# Patient Record
Sex: Female | Born: 1990 | Race: Black or African American | Hispanic: No | Marital: Single | Smoking: Current every day smoker
Health system: Southern US, Community
[De-identification: ages and names within clinical notes are randomized; demographics above are authoritative.]

## PROBLEM LIST (undated history)

## (undated) DIAGNOSIS — N611 Abscess of the breast and nipple: Secondary | ICD-10-CM

## (undated) HISTORY — PX: ANTERIOR CRUCIATE LIGAMENT REPAIR: SHX115

---

## 2012-08-12 ENCOUNTER — Emergency Department (HOSPITAL_COMMUNITY): Payer: 59

## 2012-08-12 ENCOUNTER — Emergency Department (HOSPITAL_COMMUNITY)
Admission: EM | Admit: 2012-08-12 | Discharge: 2012-08-12 | Disposition: A | Discharge status: Home / Self Care | Payer: 59 | Attending: Emergency Medicine | Admitting: Emergency Medicine

## 2012-08-12 ENCOUNTER — Encounter (HOSPITAL_COMMUNITY): Payer: Self-pay | Admitting: *Deleted

## 2012-08-12 DIAGNOSIS — R51 Headache: Secondary | ICD-10-CM | POA: Insufficient documentation

## 2012-08-12 DIAGNOSIS — IMO0002 Reserved for concepts with insufficient information to code with codable children: Secondary | ICD-10-CM | POA: Insufficient documentation

## 2012-08-12 DIAGNOSIS — Y9389 Activity, other specified: Secondary | ICD-10-CM | POA: Insufficient documentation

## 2012-08-12 DIAGNOSIS — Y92410 Unspecified street and highway as the place of occurrence of the external cause: Secondary | ICD-10-CM | POA: Insufficient documentation

## 2012-08-12 DIAGNOSIS — S0993XA Unspecified injury of face, initial encounter: Secondary | ICD-10-CM | POA: Insufficient documentation

## 2012-08-12 MED ORDER — IBUPROFEN 200 MG PO TABS
600.0000 mg | ORAL_TABLET | Freq: Once | ORAL | Status: AC
  Administered 2012-08-12: 600 mg via ORAL
  Filled 2012-08-12: qty 3

## 2012-08-12 MED ORDER — DIAZEPAM 5 MG PO TABS
5.0000 mg | ORAL_TABLET | Freq: Once | ORAL | Status: AC
  Administered 2012-08-12: 5 mg via ORAL
  Filled 2012-08-12: qty 1

## 2012-08-12 MED ORDER — HYDROCODONE-ACETAMINOPHEN 5-325 MG PO TABS: 1.0000 | ORAL_TABLET | Freq: Four times a day (QID) | ORAL | Status: DC | PRN

## 2012-08-12 MED ORDER — HYDROCODONE-ACETAMINOPHEN 5-325 MG PO TABS
1.0000 | ORAL_TABLET | Freq: Once | ORAL | Status: AC
  Administered 2012-08-12: 1 via ORAL
  Filled 2012-08-12: qty 1

## 2012-08-12 MED ORDER — NAPROXEN 500 MG PO TABS: 500.0000 mg | ORAL_TABLET | Freq: Two times a day (BID) | ORAL | Status: DC
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
## 2012-08-12 NOTE — ED Provider Notes (Signed)
Medical screening examination/treatment/procedure(s) were performed by non-physician practitioner and as supervising physician I was immediately available for consultation/collaboration.  Lyanne Co, MD 08/12/12 [phone]

## 2012-08-12 NOTE — ED Notes (Signed)
Pt to CT

## 2012-08-12 NOTE — ED Notes (Signed)
Pt back from CT

## 2012-08-12 NOTE — ED Notes (Signed)
Pt escorted to discharge window. Pt verbalized understanding discharge instructions. In no acute distress.  
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
## 2012-08-14 ENCOUNTER — Encounter (HOSPITAL_COMMUNITY): Payer: Self-pay | Admitting: Emergency Medicine

## 2012-08-14 ENCOUNTER — Emergency Department (HOSPITAL_COMMUNITY)
Admission: EM | Admit: 2012-08-14 | Discharge: 2012-08-14 | Disposition: A | Discharge status: Home / Self Care | Payer: 59 | Attending: Emergency Medicine | Admitting: Emergency Medicine

## 2012-08-14 DIAGNOSIS — F17200 Nicotine dependence, unspecified, uncomplicated: Secondary | ICD-10-CM | POA: Insufficient documentation

## 2012-08-14 DIAGNOSIS — M542 Cervicalgia: Secondary | ICD-10-CM | POA: Insufficient documentation

## 2012-08-14 DIAGNOSIS — M545 Low back pain, unspecified: Secondary | ICD-10-CM | POA: Insufficient documentation

## 2012-08-14 DIAGNOSIS — S134XXD Sprain of ligaments of cervical spine, subsequent encounter: Secondary | ICD-10-CM

## 2012-08-14 DIAGNOSIS — S139XXA Sprain of joints and ligaments of unspecified parts of neck, initial encounter: Secondary | ICD-10-CM | POA: Insufficient documentation

## 2012-08-14 DIAGNOSIS — R51 Headache: Secondary | ICD-10-CM | POA: Insufficient documentation

## 2012-08-14 DIAGNOSIS — Y939 Activity, unspecified: Secondary | ICD-10-CM | POA: Insufficient documentation

## 2012-08-14 DIAGNOSIS — Y92410 Unspecified street and highway as the place of occurrence of the external cause: Secondary | ICD-10-CM | POA: Insufficient documentation
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
## 2012-08-15 NOTE — ED Provider Notes (Signed)
Medical screening examination/treatment/procedure(s) were performed by non-physician practitioner and as supervising physician I was immediately available for consultation/collaboration.     Bailey Faiella R Gearldine Looney, MD 08/15/12 0005 

## 2012-08-24 ENCOUNTER — Ambulatory Visit: Payer: No Typology Code available for payment source | Attending: Family Medicine | Admitting: Physical Therapy

## 2012-08-24 DIAGNOSIS — IMO0001 Reserved for inherently not codable concepts without codable children: Secondary | ICD-10-CM | POA: Insufficient documentation

## 2012-08-24 DIAGNOSIS — M545 Low back pain, unspecified: Secondary | ICD-10-CM | POA: Insufficient documentation

## 2012-08-24 DIAGNOSIS — R293 Abnormal posture: Secondary | ICD-10-CM | POA: Insufficient documentation

## 2012-08-24 DIAGNOSIS — M542 Cervicalgia: Secondary | ICD-10-CM | POA: Insufficient documentation

## 2012-08-27 ENCOUNTER — Ambulatory Visit: Payer: No Typology Code available for payment source | Admitting: Rehabilitation

## 2012-09-01 ENCOUNTER — Ambulatory Visit: Payer: No Typology Code available for payment source | Attending: Family Medicine | Admitting: Physical Therapy

## 2012-09-03 ENCOUNTER — Ambulatory Visit: Payer: No Typology Code available for payment source | Admitting: Physical Therapy

## 2012-09-08 ENCOUNTER — Ambulatory Visit: Payer: No Typology Code available for payment source | Admitting: Physical Therapy

## 2012-09-10 ENCOUNTER — Ambulatory Visit: Payer: No Typology Code available for payment source | Admitting: Rehabilitation

## 2012-11-28 ENCOUNTER — Inpatient Hospital Stay (HOSPITAL_COMMUNITY)
Admission: EM | Admit: 2012-11-28 | Discharge: 2012-11-30 | DRG: 585 | Disposition: A | Discharge status: Home / Self Care | Payer: 59 | Attending: General Surgery | Admitting: General Surgery

## 2012-11-28 ENCOUNTER — Encounter (HOSPITAL_COMMUNITY): Payer: Self-pay | Admitting: Emergency Medicine

## 2012-11-28 ENCOUNTER — Emergency Department (HOSPITAL_COMMUNITY): Payer: 59

## 2012-11-28 DIAGNOSIS — N611 Abscess of the breast and nipple: Secondary | ICD-10-CM

## 2012-11-28 DIAGNOSIS — N61 Mastitis without abscess: Principal | ICD-10-CM | POA: Diagnosis present

## 2012-11-28 DIAGNOSIS — F17200 Nicotine dependence, unspecified, uncomplicated: Secondary | ICD-10-CM | POA: Diagnosis present

## 2012-11-28 HISTORY — DX: Abscess of the breast and nipple: N61.1

## 2012-11-28 LAB — CBC WITH DIFFERENTIAL/PLATELET
Basophils Relative: 0 % (ref 0–1)
Eosinophils Absolute: 0 10*3/uL (ref 0.0–0.7)
Hemoglobin: 12.8 g/dL (ref 12.0–15.0)
Lymphs Abs: 1.9 10*3/uL (ref 0.7–4.0)
Monocytes Relative: 7 % (ref 3–12)
Neutro Abs: 7.9 10*3/uL — ABNORMAL HIGH (ref 1.7–7.7)
Neutrophils Relative %: 74 % (ref 43–77)
Platelets: 332 10*3/uL (ref 150–400)
RBC: 4.56 MIL/uL (ref 3.87–5.11)

## 2012-11-28 LAB — BASIC METABOLIC PANEL
BUN: 10 mg/dL (ref 6–23)
Chloride: 100 mEq/L (ref 96–112)
GFR calc Af Amer: >90 mL/min (ref >90)
GFR calc non Af Amer: >90 mL/min (ref >90)
Glucose, Bld: 96 mg/dL (ref 70–99)
Potassium: 3.6 mEq/L (ref 3.5–5.1)
Sodium: 138 mEq/L (ref 135–145)

## 2012-11-28 MED ORDER — SODIUM CHLORIDE 0.9 % IV SOLN: INTRAVENOUS | Status: DC

## 2012-11-28 MED ORDER — ACETAMINOPHEN 650 MG RE SUPP: 650.0000 mg | Freq: Four times a day (QID) | RECTAL | Status: DC | PRN

## 2012-11-28 MED ORDER — ONDANSETRON HCL 4 MG/2ML IJ SOLN
4.0000 mg | Freq: Once | INTRAMUSCULAR | Status: AC
  Administered 2012-11-28: 4 mg via INTRAVENOUS
  Filled 2012-11-28: qty 2

## 2012-11-28 MED ORDER — SODIUM CHLORIDE 0.9 % IV BOLUS (SEPSIS)
1000.0000 mL | Freq: Once | INTRAVENOUS | Status: AC
  Administered 2012-11-28: 1000 mL via INTRAVENOUS

## 2012-11-28 MED ORDER — MORPHINE SULFATE 4 MG/ML IJ SOLN
4.0000 mg | Freq: Once | INTRAMUSCULAR | Status: AC
  Administered 2012-11-28: 4 mg via INTRAVENOUS
  Filled 2012-11-28: qty 1

## 2012-11-28 MED ORDER — ACETAMINOPHEN 325 MG PO TABS: 650.0000 mg | ORAL_TABLET | Freq: Four times a day (QID) | ORAL | Status: DC | PRN

## 2012-11-28 MED ORDER — SODIUM CHLORIDE 0.9 % IV SOLN
3.0000 g | Freq: Four times a day (QID) | INTRAVENOUS | Status: DC
  Administered 2012-11-29 – 2012-11-30 (×7): 3 g via INTRAVENOUS
  Filled 2012-11-28 (×9): qty 3

## 2012-11-28 MED ORDER — MORPHINE SULFATE 4 MG/ML IJ SOLN
4.0000 mg | Freq: Once | INTRAMUSCULAR | Status: AC | PRN
  Administered 2012-11-28: 4 mg via INTRAVENOUS
  Filled 2012-11-28: qty 1

## 2012-11-28 MED ORDER — ONDANSETRON HCL 4 MG/2ML IJ SOLN
4.0000 mg | Freq: Four times a day (QID) | INTRAMUSCULAR | Status: DC | PRN
  Administered 2012-11-29 (×2): 4 mg via INTRAVENOUS
  Filled 2012-11-28 (×2): qty 2

## 2012-11-28 MED ORDER — MORPHINE SULFATE 2 MG/ML IJ SOLN
2.0000 mg | INTRAMUSCULAR | Status: DC | PRN
  Administered 2012-11-29: 2 mg via INTRAVENOUS
  Filled 2012-11-28 (×2): qty 1

## 2012-11-28 MED ORDER — OXYCODONE HCL 5 MG PO TABS: 5.0000 mg | ORAL_TABLET | ORAL | Status: DC | PRN

## 2012-11-28 NOTE — ED Provider Notes (Signed)
CSN: [id number]     Arrival date & time 11/28/12  1416 History  This chart was scribed for non-physician practitioner, Marlon Pel, PA-C working with Doug Sou, MD by Greggory Stallion, ED scribe. This patient was seen in room TR05C/TR05C and the patient's care was started at 3:09 PM.   Chief Complaint  Patient presents with  . Breast Pain   The history is provided by the patient. No language interpreter was used.    HPI Comments: Martha Nash is a 22 y.o. female who presents to the Emergency Department complaining of gradual onset, intermittent right breat redness and swelling that started 1 week ago. She states it normally starts when her menses does and ends when the menses does. Pt states it has increased in size and tenderness. Pt denies fever, emesis, weakness and nipple discharge as associated symptoms. Pt is not currently breastfeeding. Pt has no chronic medical conditions.   History reviewed. No pertinent past medical history. Past Surgical History  Procedure Laterality Date  . Anterior cruciate ligament repair     History reviewed. No pertinent family history. History  Substance Use Topics  . Smoking status: Current Every Day Smoker    Types: Cigarettes  . Smokeless tobacco: Not on file  . Alcohol Use: No   OB History   Grav Para Term Preterm Abortions TAB SAB Ect Mult Living                 Review of Systems  Constitutional: Negative for fever.  Gastrointestinal: Negative for vomiting.  Skin:       Right breast pain  Neurological: Negative for weakness.  All other systems reviewed and are negative.    Allergies  Review of patient's allergies indicates no known allergies.  Home Medications  No current outpatient prescriptions on file.  BP 127/86  Pulse 91  Temp(Src) 98.1 F (36.7 C) (Oral)  Resp 18  SpO2 99%  Physical Exam  Nursing note and vitals reviewed. Constitutional: She is oriented to person, place, and time. She appears well-developed  and well-nourished. No distress.  HENT:  Head: Normocephalic and atraumatic.  Eyes: EOM are normal.  Neck: Normal range of motion. Neck supple. No tracheal deviation present.  Cardiovascular: Normal rate.   Pulmonary/Chest: Effort normal. No respiratory distress.  Musculoskeletal: Normal range of motion.  Neurological: She is alert and oriented to person, place, and time.  Skin: Skin is warm and dry. She is not diaphoretic.  Right breast large area of induration, swelling, and tenderness that involves the nipple. The nipple is inverted.   Psychiatric: She has a normal mood and affect. Her behavior is normal.    ED Course   Procedures (including critical care time)  DIAGNOSTIC STUDIES: Oxygen Saturation is 99% on RA, normal by my interpretation.    COORDINATION OF CARE: 3:30 PM- Dr. Ethelda Chick has seen the pt and recommends getting ultrasound of right breast, starting IV pain medication and antibiotic. Pt agrees to plan. I consulted with radiologist who informs me that he will be able to [place] the right breast for [place]. Will see if abscess formation has developed for I&D or if surgeon needs to be consulted.   Labs Reviewed  CBC WITH DIFFERENTIAL - Abnormal; Notable for the following:    WBC 10.7 (*)    Neutro Abs 7.9 (*)    All other components within normal limits  BASIC METABOLIC PANEL   US Breast Right  11/28/2012   *RADIOLOGY REPORT*  Clinical Data:  22 year old female  with right subareolar breast swelling and tenderness.  RIGHT BREAST ULTRASOUND  Comparison:  None  On physical exam, erythema in the areolar and perihilar regions noted.  Findings: Ultrasound is performed, showing a 2.2 x 5.2 x 5 cm abscess in the lower right subareolar breast.  Overlying skin thickening/cellulitis noted. There is no evidence of solid mass in this area.  IMPRESSION: 2.2 x 5.2 x 5 cm right subareolar abscess.   Original Report Authenticated By: Harmon Pier, M.D.   1. Breast abscess     MDM  Pt has  large breast abscess to right breast. I have spoken with Dr. Dwain Sarna [facility]) who has agreed to drain abscess.  Will start Ancef Abx. Pt currently stable.  I personally performed the services described in this documentation, which was scribed in my presence. The recorded information has been reviewed and is accurate.   Dorthula Matas, PA-C 11/28/12 1744

## 2012-11-28 NOTE — ED Notes (Signed)
Pt instructed not to eat or drink anything.  Pt voices understanding.  Pt moved to A-2 and report given to [place], [place]

## 2012-11-28 NOTE — Consult Note (Signed)
Reason for Consult:right breast abscess Referring Physician: Dr Remi Haggard  Martha Nash is an 22 y.o. female.  HPI: 22 yo otherwise healthy female except for smoking who presents with a one week history of increasing right breast pain.  This has become more red, larger and tender over the last week. There has been no drainage.  She denies fevers.  Had a prior episode with some pain in right subareolar area that resolved spontaneously.    History reviewed. No pertinent past medical history.  Past Surgical History  Procedure Laterality Date  . Anterior cruciate ligament repair      History reviewed. No pertinent family history.  Social History:  reports that she has been smoking Cigarettes.  She has been smoking about 0.00 packs per day. She does not have any smokeless tobacco history on file. She reports that she does not drink alcohol or use illicit drugs.  Allergies: No Known Allergies  Medications: I have reviewed the patient's current medications.  Results for orders placed during the hospital encounter of 11/28/12 (from the past 48 hour(s))  CBC WITH DIFFERENTIAL     Status: Abnormal   Collection Time    11/28/12  4:08 PM      Result Value Range   WBC 10.7 (*) 4.0 - 10.5 K/uL   RBC 4.56  3.87 - 5.11 MIL/uL   Hemoglobin 12.8  12.0 - 15.0 g/dL   HCT 16.1  09.6 - 04.5 %   MCV 81.4  78.0 - 100.0 fL   MCH 28.1  26.0 - 34.0 pg   MCHC 34.5  30.0 - 36.0 g/dL   RDW 40.9  81.1 - 91.4 %   Platelets 332  150 - 400 K/uL   Neutrophils Relative % 74  43 - 77 %   Neutro Abs 7.9 (*) 1.7 - 7.7 K/uL   Lymphocytes Relative 18  12 - 46 %   Lymphs Abs 1.9  0.7 - 4.0 K/uL   Monocytes Relative 7  3 - 12 %   Monocytes Absolute 0.8  0.1 - 1.0 K/uL   Eosinophils Relative 0  0 - 5 %   Eosinophils Absolute 0.0  0.0 - 0.7 K/uL   Basophils Relative 0  0 - 1 %   Basophils Absolute 0.0  0.0 - 0.1 K/uL  BASIC METABOLIC PANEL     Status: None   Collection Time    11/28/12  4:08 PM      Result  Value Range   Sodium 138  135 - 145 mEq/L   Potassium 3.6  3.5 - 5.1 mEq/L   Chloride 100  96 - 112 mEq/L   CO2 27  19 - 32 mEq/L   Glucose, Bld 96  70 - 99 mg/dL   BUN 10  6 - 23 mg/dL   Creatinine, Ser 7.82  0.50 - 1.10 mg/dL   Calcium 9.4  8.4 - 95.6 mg/dL   GFR calc non Af Amer >90  >90 mL/min   GFR calc Af Amer >90  >90 mL/min   Comment:            The eGFR has been calculated     using the CKD EPI equation.     This calculation has not been     validated in all clinical     situations.     eGFR's persistently     <90 mL/min signify     possible Chronic Kidney Disease.    US Breast Right  11/28/2012   *  RADIOLOGY REPORT*  Clinical Data:  22 year old female with right subareolar breast swelling and tenderness.  RIGHT BREAST ULTRASOUND  Comparison:  None  On physical exam, erythema in the areolar and perihilar regions noted.  Findings: Ultrasound is performed, showing a 2.2 x 5.2 x 5 cm abscess in the lower right subareolar breast.  Overlying skin thickening/cellulitis noted. There is no evidence of solid mass in this area.  IMPRESSION: 2.2 x 5.2 x 5 cm right subareolar abscess.   Original Report Authenticated By: Harmon Pier, M.D.    Review of Systems  Constitutional: Negative for fever and chills.  Respiratory: Negative for cough.   Cardiovascular: Negative for chest pain.  Gastrointestinal: Negative for abdominal pain.  Musculoskeletal: Negative for myalgias.   Blood pressure 119/77, pulse 68, temperature 98.1 F (36.7 C), temperature source Oral, resp. rate 16, SpO2 100.00%. Physical Exam  Nursing note and vitals reviewed. Constitutional: She appears well-developed and well-nourished.  Eyes: No scleral icterus.  Cardiovascular: Normal rate, regular rhythm and normal heart sounds.   Respiratory: Effort normal and breath sounds normal. She has no wheezes. She has no rales. Right breast exhibits skin change and tenderness (right breast abscess with fluctuance, tenderness and  erythema).  Lymphadenopathy:    She has no cervical adenopathy.  Skin: She is not diaphoretic.    Assessment/Plan: Right breast abscess  This has been present for a week.  The OR is full right now so I discussed a right breast abscess incision and drainage in the morning.  I will place on iv abx.  We discussed open wound postop and smoking cessation also.  Martha Nash 11/28/2012, 7:38 PM

## 2012-11-28 NOTE — ED Provider Notes (Signed)
Patient with large swollen red and tender area over right breast. It involves most of breast and nipple. Not currently breast-feeding. No other complaint.  Doug Sou, MD 11/28/12 1537

## 2012-11-28 NOTE — ED Provider Notes (Signed)
Medical screening examination/treatment/procedure(s) were conducted as a shared visit with non-physician practitioner(s) and myself.  I personally evaluated the patient during the encounter  Doug Sou, MD 11/28/12 2003

## 2012-11-28 NOTE — ED Notes (Addendum)
Pt sts redness and swelling to right breast with x 2 with menstrual cycle; pt sts painful; pt denies fever or discharge from nipple

## 2012-11-28 NOTE — ED Notes (Signed)
Right breast red, hard and painful. Onset 1 week ago. Had a similar episode in June but it resolved itself. States it happens with menstrual cycle.

## 2012-11-29 ENCOUNTER — Encounter (HOSPITAL_COMMUNITY): Payer: Self-pay | Admitting: Anesthesiology

## 2012-11-29 ENCOUNTER — Encounter (HOSPITAL_COMMUNITY): Admission: EM | Disposition: A | Discharge status: Home / Self Care | Payer: Self-pay | Source: Home / Self Care

## 2012-11-29 ENCOUNTER — Encounter (HOSPITAL_COMMUNITY): Payer: Self-pay | Admitting: *Deleted

## 2012-11-29 ENCOUNTER — Observation Stay (HOSPITAL_COMMUNITY): Payer: 59 | Admitting: Anesthesiology

## 2012-11-29 HISTORY — PX: IRRIGATION AND DEBRIDEMENT ABSCESS: SHX5252

## 2012-11-29 LAB — SURGICAL PCR SCREEN
MRSA, PCR: NEGATIVE
Staphylococcus aureus: NEGATIVE

## 2012-11-29 SURGERY — IRRIGATION AND DEBRIDEMENT ABSCESS
Anesthesia: General | Site: Breast | Laterality: Right | Wound class: Dirty or Infected

## 2012-11-29 MED ORDER — ONDANSETRON HCL 4 MG/2ML IJ SOLN: 4.0000 mg | Freq: Once | INTRAMUSCULAR | Status: AC | PRN

## 2012-11-29 MED ORDER — LIDOCAINE HCL (CARDIAC) 20 MG/ML IV SOLN
INTRAVENOUS | Status: DC | PRN
  Administered 2012-11-29: 80 mg via INTRAVENOUS

## 2012-11-29 MED ORDER — FENTANYL CITRATE 0.05 MG/ML IJ SOLN
INTRAMUSCULAR | Status: DC | PRN
  Administered 2012-11-29: 150 ug via INTRAVENOUS
  Administered 2012-11-29: 100 ug via INTRAVENOUS

## 2012-11-29 MED ORDER — PROPOFOL 10 MG/ML IV BOLUS
INTRAVENOUS | Status: DC | PRN
  Administered 2012-11-29: 200 mg via INTRAVENOUS

## 2012-11-29 MED ORDER — MIDAZOLAM HCL 5 MG/5ML IJ SOLN
INTRAMUSCULAR | Status: DC | PRN
  Administered 2012-11-29: 2 mg via INTRAVENOUS

## 2012-11-29 MED ORDER — LACTATED RINGERS IV SOLN
INTRAVENOUS | Status: DC | PRN
  Administered 2012-11-29: 07:00:00 via INTRAVENOUS

## 2012-11-29 MED ORDER — ONDANSETRON HCL 4 MG/2ML IJ SOLN
INTRAMUSCULAR | Status: DC | PRN
  Administered 2012-11-29: 4 mg via INTRAVENOUS

## 2012-11-29 MED ORDER — 0.9 % SODIUM CHLORIDE (POUR BTL) OPTIME
TOPICAL | Status: DC | PRN
  Administered 2012-11-29: 1000 mL

## 2012-11-29 MED ORDER — HYDROMORPHONE HCL PF 1 MG/ML IJ SOLN
0.2500 mg | INTRAMUSCULAR | Status: DC | PRN
  Administered 2012-11-29 (×2): 0.5 mg via INTRAVENOUS

## 2012-11-29 MED ORDER — DEXAMETHASONE SODIUM PHOSPHATE 4 MG/ML IJ SOLN
INTRAMUSCULAR | Status: DC | PRN
  Administered 2012-11-29: 4 mg via INTRAVENOUS

## 2012-11-29 SURGICAL SUPPLY — 27 items
BANDAGE GAUZE ELAST BULKY 4 IN (GAUZE/BANDAGES/DRESSINGS) IMPLANT
CANISTER SUCTION 2500CC (MISCELLANEOUS) ×2 IMPLANT
CLOTH BEACON ORANGE TIMEOUT ST (SAFETY) ×2 IMPLANT
COVER SURGICAL LIGHT HANDLE (MISCELLANEOUS) ×2 IMPLANT
DRAPE LAPAROSCOPIC ABDOMINAL (DRAPES) ×2 IMPLANT
DRAPE UTILITY 15X26 W/TAPE STR (DRAPE) ×4 IMPLANT
DRSG PAD ABDOMINAL 8X10 ST (GAUZE/BANDAGES/DRESSINGS) ×2 IMPLANT
ELECT CAUTERY BLADE 6.4 (BLADE) ×2 IMPLANT
ELECT REM PT RETURN 9FT ADLT (ELECTROSURGICAL) ×2
ELECTRODE REM PT RTRN 9FT ADLT (ELECTROSURGICAL) ×1 IMPLANT
GAUZE PACKING IODOFORM 1/4X5 (PACKING) ×2 IMPLANT
GLOVE BIO SURGEON STRL SZ8 (GLOVE) ×2 IMPLANT
GLOVE BIOGEL PI IND STRL 8 (GLOVE) ×1 IMPLANT
GLOVE BIOGEL PI INDICATOR 8 (GLOVE) ×1
GOWN STRL NON-REIN LRG LVL3 (GOWN DISPOSABLE) ×2 IMPLANT
GOWN STRL REIN XL XLG (GOWN DISPOSABLE) ×2 IMPLANT
KIT BASIN OR (CUSTOM PROCEDURE TRAY) ×2 IMPLANT
KIT ROOM TURNOVER OR (KITS) ×2 IMPLANT
NS IRRIG 1000ML POUR BTL (IV SOLUTION) ×2 IMPLANT
PACK GENERAL/GYN (CUSTOM PROCEDURE TRAY) ×2 IMPLANT
PAD ARMBOARD 7.5X6 YLW CONV (MISCELLANEOUS) ×2 IMPLANT
SPONGE GAUZE 4X4 12PLY (GAUZE/BANDAGES/DRESSINGS) ×2 IMPLANT
SWAB COLLECTION DEVICE MRSA (MISCELLANEOUS) ×2 IMPLANT
TAPE CLOTH SURG 6X10 WHT LF (GAUZE/BANDAGES/DRESSINGS) ×2 IMPLANT
TOWEL OR 17X24 6PK STRL BLUE (TOWEL DISPOSABLE) ×2 IMPLANT
TOWEL OR 17X26 10 PK STRL BLUE (TOWEL DISPOSABLE) ×2 IMPLANT
TUBE ANAEROBIC SPECIMEN COL (MISCELLANEOUS) ×2 IMPLANT

## 2012-11-29 NOTE — Anesthesia Preprocedure Evaluation (Addendum)
Anesthesia Evaluation  Patient identified by MRN, date of birth, ID band Patient awake    Reviewed: Allergy & Precautions, H&P , NPO status , Patient's Chart, lab work & pertinent test results  Airway Mallampati: I TM Distance: >3 FB Neck ROM: Full    Dental  (+) Teeth Intact and Dental Advisory Given   Pulmonary Current Smoker,  breath sounds clear to auscultation        Cardiovascular negative cardio ROS  Rhythm:Regular Rate:Normal     Neuro/Psych negative neurological ROS  negative psych ROS   GI/Hepatic negative GI ROS, Neg liver ROS,   Endo/Other  negative endocrine ROS  Renal/GU negative Renal ROS     Musculoskeletal negative musculoskeletal ROS (+)   Abdominal (+) + obese,   Peds  Hematology negative hematology ROS (+)   Anesthesia Other Findings   Reproductive/Obstetrics negative OB ROS                         Anesthesia Physical Anesthesia Plan  ASA: II  Anesthesia Plan: General   Post-op Pain Management:    Induction: Intravenous  Airway Management Planned: LMA  Additional Equipment:   Intra-op Plan:   Post-operative Plan: Extubation in OR  Informed Consent: I have reviewed the patients History and Physical, chart, labs and discussed the procedure including the risks, benefits and alternatives for the proposed anesthesia with the patient or authorized representative who has indicated his/her understanding and acceptance.   Dental advisory given and History available from chart only  Plan Discussed with: CRNA and Anesthesiologist  Anesthesia Plan Comments: (R. Breast Abscess  Smoker  Plan GA with LMA  Kipp Brood, MD)       Anesthesia Quick Evaluation

## 2012-11-29 NOTE — Anesthesia Procedure Notes (Signed)
Procedure Name: LMA Insertion Date/Time: 11/29/2012 8:09 AM Performed by: Leona Singleton A Pre-anesthesia Checklist: Patient identified, Emergency Drugs available, Suction available and Patient being monitored Patient Re-evaluated:Patient Re-evaluated prior to inductionOxygen Delivery Method: Circle system utilized Preoxygenation: Pre-oxygenation with 100% oxygen Intubation Type: IV induction Ventilation: Mask ventilation without difficulty LMA: LMA inserted LMA Size: 4.0 Tube type: Oral Number of attempts: 1 Placement Confirmation: positive ETCO2 and breath sounds checked- equal and bilateral Tube secured with: Tape Dental Injury: Teeth and Oropharynx as per pre-operative assessment

## 2012-11-29 NOTE — Preoperative (Signed)
Beta Blockers   Reason not to administer Beta Blockers:Not Applicable 

## 2012-11-29 NOTE — Transfer of Care (Signed)
Immediate Anesthesia Transfer of Care Note  Patient: Martha Nash  Procedure(s) Performed: Procedure(s): IRRIGATION AND DEBRIDEMENT RIGHT BREAST ABSCESS (Right)  Patient Location: PACU  Anesthesia Type:General  Level of Consciousness: awake, alert , oriented and patient cooperative  Airway & Oxygen Therapy: Patient Spontanous Breathing  Post-op Assessment: Report given to PACU RN and Post -op Vital signs reviewed and stable  Post vital signs: Reviewed and stable  Complications: No apparent anesthesia complications

## 2012-11-29 NOTE — Progress Notes (Signed)
Day of Surgery  Subjective: Pain R breast  Objective: Vital signs in last 24 hours: Temp:  [98.1 F (36.7 C)-99.2 F (37.3 C)] 99.2 F (37.3 C) (08/10 0550) Pulse Rate:  [67-91] 68 (08/09 2030) Resp:  [16-18] 18 (08/10 0550) BP: (119-127)/(60-86) 121/70 mmHg (08/10 0550) SpO2:  [99 %-100 %] 99 % (08/10 0550) Weight:  [110.3 kg (243 lb 2.7 oz)] 110.3 kg (243 lb 2.7 oz) (08/09 2030) Last BM Date: 11/28/12  Intake/Output from previous day: 08/09 0701 - 08/10 0700 In: 1000 [I.V.:1000] Out: -  Intake/Output this shift:    Resp: clear to auscultation bilaterally Cardio: regular rate and rhythm GI: soft, NT R breast with periareolar erythema and fluctuance, tender  Lab Results:   Recent Labs  11/28/12 1608  WBC 10.7*  HGB 12.8  HCT 37.1  PLT 332   BMET  Recent Labs  11/28/12 1608  NA 138  K 3.6  CL 100  CO2 27  GLUCOSE 96  BUN 10  CREATININE 0.83  CALCIUM 9.4   PT/INR No results found for this basename: LABPROT, INR,  in the last 72 hours ABG No results found for this basename: PHART, PCO2, PO2, HCO3,  in the last 72 hours  Studies/Results: US Breast Right  11/28/2012   *RADIOLOGY REPORT*  Clinical Data:  22 year old female with right subareolar breast swelling and tenderness.  RIGHT BREAST ULTRASOUND  Comparison:  None  On physical exam, erythema in the areolar and perihilar regions noted.  Findings: Ultrasound is performed, showing a 2.2 x 5.2 x 5 cm abscess in the lower right subareolar breast.  Overlying skin thickening/cellulitis noted. There is no evidence of solid mass in this area.  IMPRESSION: 2.2 x 5.2 x 5 cm right subareolar abscess.   Original Report Authenticated By: Harmon Pier, M.D.    Anti-infectives: Anti-infectives   Start     Dose/Rate Route Frequency Ordered Stop   11/28/12 2200  Ampicillin-Sulbactam (UNASYN) 3 g in sodium chloride 0.9 % 100 mL IVPB     3 g 100 mL/hr over 60 Minutes Intravenous Every 6 hours 11/28/12 2130         Assessment/Plan: s/p Procedure(s): IRRIGATION AND DEBRIDEMENT RIGHT BREAST ABSCESS (Right) R breast abscess - to or for I&D. Procedure, risks, and benefits D/W patient and she agrees.  LOS: 1 day    Ashia Dehner E 11/29/2012

## 2012-11-29 NOTE — Op Note (Signed)
11/28/2012 - 11/29/2012  8:27 AM  PATIENT:  Martha Nash  22 y.o. female  PRE-OPERATIVE DIAGNOSIS:  right breast abscess  POST-OPERATIVE DIAGNOSIS:  right breast abscess  PROCEDURE:  Procedure(s): IRRIGATION, DRAINAGE,  AND DEBRIDEMENT RIGHT BREAST ABSCESS  SURGEON:  Surgeon(s): [name], MD  PHYSICIAN ASSISTANT:   ASSISTANTS: none   ANESTHESIA:   general  EBL:     BLOOD ADMINISTERED:none  DRAINS: none   SPECIMEN:  No Specimen  DISPOSITION OF SPECIMEN:  N/A  COUNTS:  YES  DICTATION: .Dragon Dictation  Patient was admitted with a 5 cm sub-areola right breast abscess. She is brought for incision and drainage. Informed consent was obtained. Her site was marked.She is on antibiotic protocol IV. She was brought to the operating room and general anesthesia with laryngeal mask airway was administered by the anesthesia staff. Right breast was prepped and draped in sterile fashion. We did time out procedure. Initially 2 cm blister was debrided away with gentle dissection along the lateral aspect of her areola. Next a curvilinear incision along her area was made on the lateral aspect. Subcutaneous tissues were dissected down entering a large abscess cavity. There was purulent material there. This was sent for culture. Some loculations in the cavity were broken up. It was fully evacuated. It was then irrigated multiple times with sterile saline. Hemostasis was obtained with cautery. Wound was packed with quarter-inch form gauze and a sterile dressing was applied. All counts were correct. Patient tolerated procedure well without apparent complication and was taken recovery in stable condition.  PATIENT DISPOSITION:  PACU - hemodynamically stable.   Delay start of Pharmacological VTE agent (>24hrs) due to surgical blood loss or risk of bleeding:  no  Violeta Gelinas, MD, MPH, FACS Pager: [phone]  8/10/20148:27 AM

## 2012-11-29 NOTE — Anesthesia Postprocedure Evaluation (Signed)
  Anesthesia Post-op Note  Patient: Martha Nash  Procedure(s) Performed: Procedure(s): IRRIGATION AND DEBRIDEMENT RIGHT BREAST ABSCESS (Right)  Patient Location: PACU  Anesthesia Type:General  Level of Consciousness: awake, alert  and oriented  Airway and Oxygen Therapy: Patient Spontanous Breathing  Post-op Pain: mild  Post-op Assessment: Post-op Vital signs reviewed, Patient's Cardiovascular Status Stable, Respiratory Function Stable, Patent Airway and Pain level controlled  Post-op Vital Signs: stable  Complications: No apparent anesthesia complications

## 2012-11-30 ENCOUNTER — Encounter (HOSPITAL_COMMUNITY): Payer: Self-pay | Admitting: General Surgery

## 2012-11-30 DIAGNOSIS — N611 Abscess of the breast and nipple: Secondary | ICD-10-CM

## 2012-11-30 HISTORY — DX: Abscess of the breast and nipple: N61.1

## 2012-11-30 MED ORDER — AMOXICILLIN-POT CLAVULANATE 875-125 MG PO TABS: 1.0000 | ORAL_TABLET | Freq: Two times a day (BID) | ORAL | Status: DC

## 2012-11-30 MED ORDER — OXYCODONE HCL 5 MG PO TABS: 5.0000 mg | ORAL_TABLET | Freq: Four times a day (QID) | ORAL | Status: DC | PRN

## 2012-11-30 MED ORDER — IBUPROFEN 600 MG PO TABS: 600.0000 mg | ORAL_TABLET | Freq: Four times a day (QID) | ORAL | Status: DC | PRN

## 2012-11-30 MED ORDER — IBUPROFEN 200 MG PO TABS: ORAL_TABLET | ORAL | Status: DC

## 2012-11-30 MED ORDER — ACETAMINOPHEN 325 MG PO TABS: 650.0000 mg | ORAL_TABLET | Freq: Four times a day (QID) | ORAL | Status: DC | PRN

## 2012-11-30 NOTE — Discharge Summary (Signed)
Physician Discharge Summary  Patient ID: Martha Nash MRN: [id number] DOB/AGE: 11-28-1990 22 y.o.  Admit date: 11/28/2012 Discharge date: 11/30/2012  Admission Diagnoses:  Right breast abscess  Discharge Diagnoses:  Same Principal Problem:   Abscess of breast, right   PROCEDURES:  IRRIGATION, DRAINAGE, AND DEBRIDEMENT RIGHT BREAST ABSCESS, 11/29/2012, [name], MD.   Hospital Course:  22 yo otherwise healthy female except for smoking who presents with a one week history of increasing right breast pain. This has become more red, larger and tender over the last week. There has been no drainage. She denies fevers. Had a prior episode with some pain in right subareolar area that resolved spontaneously.  She was admitted and take to the OR for surgery the following AM.  She did well post op with the dressing changed.  Her cellulitis was better, but still present.  She was discharged home on antibiotics for 1 week.  Dressing changes at home with some minimal packing, and follow up next week in the Urgent clinic.  Dr. Janee Morn was not in the office for the next two weeks.  Condition on d/c:  Improved   Disposition: 01-Home or Self Care      Discharge Orders   Future Appointments Provider Department Dept Phone   12/07/2012 2:30 PM Lodema Pilot, DO Urgent [facility], [place] [phone]   Future Orders Complete By Expires     Change dressing  As directed     Scheduling Instructions:      You can shower and then let packing fall out or remove.  Wash area with soap and water. Repack with just a single layer of iodoform dressing to the base if the wound.   Do dressing change twice a day.  The wound should get smaller and need less packing each day. Call if you have redness, pain, fever or drainage from site.        Medication List         acetaminophen 325 MG tablet  Commonly known as:  TYLENOL  Take 2 tablets (650 mg total) by mouth every 6 (six) hours as  needed.     amoxicillin-clavulanate 875-125 MG per tablet  Commonly known as:  AUGMENTIN  Take 1 tablet by mouth every 12 (twelve) hours.     ibuprofen 200 MG tablet  Commonly known as:  ADVIL,MOTRIN  You can take 2-3 tablets every 6 hours as needed for pain.     oxyCODONE 5 MG immediate release tablet  Commonly known as:  Oxy IR/ROXICODONE  Take 1-2 tablets (5-10 mg total) by mouth every 6 (six) hours as needed for pain (For packing wound also.).       Follow-up Information   Follow up with Lodema Pilot DAVID, DO On 12/07/2012. (Your appointment is at 2:30, be there 30 minutes early for check in;)    Contact information:   [address] [place] [address] [phone]       Signed: Sherrie George 11/30/2012, 1:47 PM

## 2012-11-30 NOTE — Progress Notes (Signed)
Discharge Note. Pt given discharge instructions, follow up appointment, and Rx's. Medications reviewed with pt and when to take them. Education done on signs and symptoms of infections. Pt receptive. Pt ready for discharge. Pt reported she is calling a ride to come get her.

## 2012-11-30 NOTE — Progress Notes (Signed)
Pt to d/c home with [facility] for dressing changes, W->D BID. Pt reports that she will be able to do dressing changes using the mirror at the times that the [facility] is not able to come. Discussed s/s infection and importance of taking full course of antibiotics. Address and phone number listed in the system are correct as confirmed by patient. Agency will be [facility] per pt choice and insurance network options.

## 2012-11-30 NOTE — Progress Notes (Addendum)
1 Day Post-Op  Subjective: She is doing well this AM, still pretty sore and tender.  Objective: Vital signs in last 24 hours: Temp:  [97.9 F (36.6 C)-98.8 F (37.1 C)] 98.1 F (36.7 C) (08/11 0528) Pulse Rate:  [61-77] 61 (08/11 0528) Resp:  [16-18] 18 (08/11 0528) BP: (111-132)/(61-75) 119/65 mmHg (08/11 0528) SpO2:  [96 %-100 %] 100 % (08/11 0528) Last BM Date: 11/28/12 Diet: regular, afebrile, VSS No labs Intake/Output from previous day: 08/10 0701 - 08/11 0700 In: 1620 [P.O.:120; I.V.:1200; IV Piggyback:300] Out: 50 [Urine:50] Intake/Output this shift:    General appearance: alert, cooperative and no distress Skin: She has an open area with packing under the areola of the right breast.  She has some mild cellulitis surrounding the proximal incision.  Lab Results:   Recent Labs  11/28/12 1608  WBC 10.7*  HGB 12.8  HCT 37.1  PLT 332    BMET  Recent Labs  11/28/12 1608  NA 138  K 3.6  CL 100  CO2 27  GLUCOSE 96  BUN 10  CREATININE 0.83  CALCIUM 9.4   PT/INR No results found for this basename: LABPROT, INR,  in the last 72 hours  No results found for this basename: AST, ALT, ALKPHOS, BILITOT, PROT, ALBUMIN,  in the last 168 hours   Lipase  No results found for this basename: lipase     Studies/Results: US Breast Right  11/28/2012   *RADIOLOGY REPORT*  Clinical Data:  22 year old female with right subareolar breast swelling and tenderness.  RIGHT BREAST ULTRASOUND  Comparison:  None  On physical exam, erythema in the areolar and perihilar regions noted.  Findings: Ultrasound is performed, showing a 2.2 x 5.2 x 5 cm abscess in the lower right subareolar breast.  Overlying skin thickening/cellulitis noted. There is no evidence of solid mass in this area.  IMPRESSION: 2.2 x 5.2 x 5 cm right subareolar abscess.   Original Report Authenticated By: Harmon Pier, M.D.    Medications: . ampicillin-sulbactam (UNASYN) IV  3 g Intravenous Q6H     Assessment/Plan right breast abscess, s/p IRRIGATION, DRAINAGE, AND DEBRIDEMENT RIGHT BREAST ABSCESS, 11/29/2012, [name], MD   Plan:  We removed the packing and Placed new packing to the base of the incision with 1 inch iodoform.  Then a dry sterile dressing.  We will ask Home health to see and help with dressing changes at home.  We will keep her on antibiotics at home for a week.  Dr. Janee Morn is not in the office for 2 weeks so we have set her up to go to the Urgent office and Dr. Biagio Quint next Monday. [facility] [phone]  11/30/2012 3:37 PM     LOS: 2 days    Martha Nash 11/30/2012

## 2012-12-01 NOTE — Progress Notes (Signed)
Agree with A&P of WJ,PA Patient verbalizwes understanding of post op care plans

## 2012-12-05 LAB — CULTURE, ROUTINE-ABSCESS

## 2012-12-05 LAB — ANAEROBIC CULTURE

## 2012-12-07 ENCOUNTER — Ambulatory Visit (INDEPENDENT_AMBULATORY_CARE_PROVIDER_SITE_OTHER): Payer: 59 | Admitting: General Surgery

## 2012-12-07 ENCOUNTER — Encounter (INDEPENDENT_AMBULATORY_CARE_PROVIDER_SITE_OTHER): Payer: Self-pay | Admitting: General Surgery

## 2012-12-07 VITALS — BP 120/68 | HR 80 | Resp 14 | Ht 68.0 in | Wt 240.8 lb

## 2012-12-07 DIAGNOSIS — Z4889 Encounter for other specified surgical aftercare: Secondary | ICD-10-CM

## 2012-12-07 DIAGNOSIS — Z5189 Encounter for other specified aftercare: Secondary | ICD-10-CM

## 2012-12-07 NOTE — Progress Notes (Signed)
Subjective:     Patient ID: Martha Nash, female   DOB: 1990/11/23, 22 y.o.   MRN: [id number]  HPI The patient follows up one week status post incision and drainage of right breast abscess. She says that she feels much better. She has one more day remaining of the amoxicillin but the tenderness and redness has improved. No fevers and chills. She is changing the dressing daily.  Review of Systems     Objective:   Physical Exam Her incisions is wide and very shallow with healthy granulation tissue at the base and really doesn't need much packing. I covered the wound with a Telfa pad since it really isn't anything left intact. There is no other significant or suspicious masses or residual sign of infection.    Assessment:     Status post incision and drainage of right breast abscess-doing well She is healing very well from her procedure. No evidence of any residual infection. Is no evidence of residual mass or concern for malignancy. I recommended that she do her breast exams after this is completely healed and if there is any hardness or suspicious mass that she should call his back for repeat examination. Otherwise, I think this should continue to heal with dressing changes. She can follow up with Dr. Janee Morn in about 3 weeks if this is not completely healed.     Plan:     Continue dressing changes and followup in 3 weeks if this is not completely healed. Recommend self breast exams and follow up with surgery if there is any residual mass. Finish antibiotics

## 2013-05-13 ENCOUNTER — Emergency Department (HOSPITAL_COMMUNITY)
Admission: EM | Admit: 2013-05-13 | Discharge: 2013-05-14 | Disposition: A | Discharge status: Home / Self Care | Payer: 59 | Attending: Emergency Medicine | Admitting: Emergency Medicine

## 2013-05-13 DIAGNOSIS — Z3202 Encounter for pregnancy test, result negative: Secondary | ICD-10-CM | POA: Insufficient documentation

## 2013-05-13 DIAGNOSIS — N61 Mastitis without abscess: Secondary | ICD-10-CM | POA: Insufficient documentation

## 2013-05-13 DIAGNOSIS — N611 Abscess of the breast and nipple: Secondary | ICD-10-CM

## 2013-05-13 DIAGNOSIS — F17200 Nicotine dependence, unspecified, uncomplicated: Secondary | ICD-10-CM | POA: Insufficient documentation

## 2013-05-13 LAB — COMPREHENSIVE METABOLIC PANEL
ALK PHOS: 90 U/L (ref 39–117)
ALT: 15 U/L (ref 0–35)
AST: 19 U/L (ref 0–37)
Albumin: 3.3 g/dL — ABNORMAL LOW (ref 3.5–5.2)
BILIRUBIN TOTAL: 0.2 mg/dL — AB (ref 0.3–1.2)
BUN: 11 mg/dL (ref 6–23)
CHLORIDE: 101 meq/L (ref 96–112)
CO2: 26 meq/L (ref 19–32)
CREATININE: 0.72 mg/dL (ref 0.50–1.10)
Calcium: 8.9 mg/dL (ref 8.4–10.5)
GFR calc Af Amer: >90 mL/min (ref >90)
Glucose, Bld: 105 mg/dL — ABNORMAL HIGH (ref 70–99)
POTASSIUM: 3.7 meq/L (ref 3.7–5.3)
Sodium: 140 mEq/L (ref 137–147)
Total Protein: 8 g/dL (ref 6.0–8.3)

## 2013-05-13 LAB — URINALYSIS, ROUTINE W REFLEX MICROSCOPIC
GLUCOSE, UA: NEGATIVE mg/dL
HGB URINE DIPSTICK: NEGATIVE
KETONES UR: 15 mg/dL — AB
Leukocytes, UA: NEGATIVE
Nitrite: NEGATIVE
PROTEIN: NEGATIVE mg/dL
Specific Gravity, Urine: 1.036 — ABNORMAL HIGH (ref 1.005–1.030)
UROBILINOGEN UA: 2 mg/dL — AB (ref 0.0–1.0)
pH: 7 (ref 5.0–8.0)

## 2013-05-13 LAB — CBC WITH DIFFERENTIAL/PLATELET
BASOS ABS: 0 10*3/uL (ref 0.0–0.1)
Basophils Relative: 1 % (ref 0–1)
Eosinophils Absolute: 0.1 10*3/uL (ref 0.0–0.7)
Eosinophils Relative: 1 % (ref 0–5)
HEMATOCRIT: 38.8 % (ref 36.0–46.0)
HEMOGLOBIN: 13.1 g/dL (ref 12.0–15.0)
LYMPHS PCT: 25 % (ref 12–46)
Lymphs Abs: 2.1 10*3/uL (ref 0.7–4.0)
MCH: 27.9 pg (ref 26.0–34.0)
MCHC: 33.8 g/dL (ref 30.0–36.0)
MCV: 82.6 fL (ref 78.0–100.0)
MONO ABS: 0.6 10*3/uL (ref 0.1–1.0)
MONOS PCT: 7 % (ref 3–12)
NEUTROS ABS: 5.7 10*3/uL (ref 1.7–7.7)
NEUTROS PCT: 67 % (ref 43–77)
Platelets: 324 10*3/uL (ref 150–400)
RBC: 4.7 MIL/uL (ref 3.87–5.11)
RDW: 13.7 % (ref 11.5–15.5)
WBC: 8.5 10*3/uL (ref 4.0–10.5)

## 2013-05-13 LAB — PREGNANCY, URINE: PREG TEST UR: NEGATIVE

## 2013-05-13 NOTE — ED Provider Notes (Signed)
CSN: [id number]     Arrival date & time 05/13/13  2038 History   First MD Initiated Contact with Patient 05/13/13 2308     Chief Complaint  Patient presents with  . Breast Pain    Right   (Consider location/radiation/quality/duration/timing/severity/associated sxs/prior Treatment) HPI Comments: Patient is a 23 y/o female with a hx of R breast abscess who presents for redness, swelling, and TTP of her R breast x 4 days. Patient states symptoms have been gradually worsening since onset. She has taken Aleve for symptoms without relief. Patient states pressure to the area worsens her pain. She denies associated fever, chills, chest pain, shortness of breath, nipple discharge, numbness/tingling, and extremity weakness.  The history is provided by the patient. No language interpreter was used.    Past Medical History  Diagnosis Date  . Abscess of breast, right 11/30/2012   Past Surgical History  Procedure Laterality Date  . Anterior cruciate ligament repair    . Irrigation and debridement abscess Right 11/29/2012    Procedure: IRRIGATION AND DEBRIDEMENT RIGHT BREAST ABSCESS;  Surgeon: Liz MaladyBurke E Thompson, MD;  Location: [facility] OR;  Service: General;  Laterality: Right;   No family history on file. History  Substance Use Topics  . Smoking status: Current Every Day Smoker    Types: Cigarettes  . Smokeless tobacco: Not on file  . Alcohol Use: No   OB History   Grav Para Term Preterm Abortions TAB SAB Ect Mult Living                 Review of Systems  Constitutional: Negative for fever.  Respiratory: Negative for shortness of breath.   Cardiovascular: Negative for chest pain.  Gastrointestinal: Negative for vomiting.  Musculoskeletal:       +R breast pain  Skin: Positive for color change. Negative for pallor and wound.  Neurological: Negative for weakness and numbness.  All other systems reviewed and are negative.    Allergies  Review of patient's allergies indicates no known  allergies.  Home Medications   Current Outpatient Rx  Name  Route  Sig  Dispense  Refill  . naproxen sodium (ANAPROX) 220 MG tablet   Oral   Take 440 mg by mouth 2 (two) times daily as needed (for pain).         . cephALEXin (KEFLEX) 500 MG capsule   Oral   Take 1 capsule (500 mg total) by mouth 4 (four) times daily.   28 capsule   0   . sulfamethoxazole-trimethoprim (BACTRIM DS,SEPTRA DS) 800-160 MG per tablet   Oral   Take 1 tablet by mouth 2 (two) times daily.   14 tablet   0    BP 108/59  Pulse 64  Temp(Src) 97.8 F (36.6 C) (Oral)  Resp 14  Ht 5\' 8"  (1.727 m)  Wt 249 lb 9.6 oz (113.218 kg)  BMI 37.96 kg/m2  SpO2 99%  LMP 04/08/2013  Physical Exam  Nursing note and vitals reviewed. Constitutional: She is oriented to person, place, and time. She appears well-developed and well-nourished. No distress.  HENT:  Head: Normocephalic and atraumatic.  Eyes: Conjunctivae and EOM are normal. No scleral icterus.  Neck: Normal range of motion.  Cardiovascular: Normal rate, regular rhythm and intact distal pulses.   Distal radial pulses 2+ b/l  Pulmonary/Chest: Effort normal. No respiratory distress.  +TTP and erythema of R breast with induration extending from 7 o'clock position at edge of areola toward nipple and inferiorly. Associated erythema with heat  to touch; approximately 7cm in diameter without red linear streaking. Nipple inverted; this appears c/w prior exam and patient endorses this to be chronic.  Musculoskeletal: Normal range of motion.  Neurological: She is alert and oriented to person, place, and time.  No gross sensory deficits appreciated. Patient moves extremities without ataxia.  Skin: Skin is warm and dry. No rash noted. She is not diaphoretic. No erythema. No pallor.  Psychiatric: She has a normal mood and affect. Her behavior is normal.    ED Course  Procedures (including critical care time) Labs Review Labs Reviewed  COMPREHENSIVE METABOLIC  PANEL - Abnormal; Notable for the following:    Glucose, Bld 105 (*)    Albumin 3.3 (*)    Total Bilirubin 0.2 (*)    All other components within normal limits  URINALYSIS, ROUTINE W REFLEX MICROSCOPIC - Abnormal; Notable for the following:    Color, Urine AMBER (*)    Specific Gravity, Urine 1.036 (*)    Bilirubin Urine SMALL (*)    Ketones, ur 15 (*)    Urobilinogen, UA 2.0 (*)    All other components within normal limits  CBC WITH DIFFERENTIAL  PREGNANCY, URINE   Imaging Review [place] Misc Soft Tissue  05/14/2013   CLINICAL DATA:  Right breast swelling and erythema. History of right breast abscess.  EXAM: ULTRASOUND OF CHEST SOFT TISSUES  TECHNIQUE: Ultrasound examination of the chest wall soft tissues was performed in the area of clinical concern.  COMPARISON:  Right breast ultrasound performed 11/28/2012  FINDINGS: There is a complex predominantly fluid filled collection at the right breast, in the 7-8 o'clock region near the areola. It measures approximately 3.2 x 1.8 x 1.6 cm, with a diffusely thickened wall, internal echoes and mild associated peripheral blood flow. This is concerning for a residual or recurrent small abscess at the site of the patient's prior abscess; it is decreased in size from August 2014.  IMPRESSION: Residual or recurrent small abscess noted at the site of the patient's prior abscess, in the 7-8 o'clock region of the right breast near the areola. It measures approximately 3.2 x 1.8 x 1.6 cm, with a diffusely thickened wall and mild associated peripheral blood flow.   Electronically Signed   By: Roanna Raider M.D.   On: 05/14/2013 02:54    EKG Interpretation   None       INCISION AND DRAINAGE Performed by: Antony Madura Consent: Verbal consent obtained. Risks and benefits: risks, benefits and alternatives were discussed Type: abscess  Body area: R breast  Anesthesia: local infiltration  Incision was made with a scalpel.  Local anesthetic: lidocaine 2%  with epinephrine  Anesthetic total: 2 ml  Complexity: complex Blunt dissection to break up loculations  Drainage: purulent  Drainage amount: copious; approx 8cc  Packing material: 1/4 in iodoform gauze  Patient tolerance: Patient tolerated the procedure well with no immediate complications.    MDM   1. Abscess of breast, right    Uncomplicated R breast abscess. Patient well and nontoxic appearing, hemodynamically stable and afebrile. She is neurovascularly intact. Ultrasound shows a residual small abscess in the 7-8 o'clock region of the right breast near the areola. Abscess I&D'd at beside without complication with copious drainage. No red linear streaking or nipple d/c. Patient stable for d/c with course of Bactrim/Keflex. She has been instructed to followup with [facility] for further evaluation of her symptoms as well as to return in 48 hours for packing removal. Warm compresses advised  as well as ibuprofen for pain control. Return precautions provided and patient agreeable to plan with no unaddressed concerns.   Filed Vitals:   05/14/13 0000 05/14/13 0030 05/14/13 0200 05/14/13 0419  BP: 116/65 113/65 108/59 132/76  Pulse: 88 87 64 84  Temp:      TempSrc:      Resp:   14 16  Height:      Weight:      SpO2: 96% 98% 99% 99%     Antony Madura, PA-C 05/14/13 0423

## 2013-05-13 NOTE — ED Notes (Addendum)
Pt reports right breast pain. Pt states she has a hx of right breast abscess with similar symptoms tonight. Pt states she feels a "lump", warm to the touch and tender with no drainage.

## 2013-05-13 NOTE — ED Notes (Signed)
Pt states the last time she had an abscess on her right breast she had to go the OR to have it drained

## 2013-05-14 ENCOUNTER — Emergency Department (HOSPITAL_COMMUNITY): Payer: 59

## 2013-05-14 MED ORDER — SULFAMETHOXAZOLE-TRIMETHOPRIM 800-160 MG PO TABS: 1.0000 | ORAL_TABLET | Freq: Two times a day (BID) | ORAL | Status: AC

## 2013-05-14 MED ORDER — CEPHALEXIN 500 MG PO CAPS: 500.0000 mg | ORAL_CAPSULE | Freq: Four times a day (QID) | ORAL | Status: DC

## 2013-05-14 MED ORDER — OXYCODONE-ACETAMINOPHEN 5-325 MG PO TABS
2.0000 | ORAL_TABLET | Freq: Once | ORAL | Status: AC
  Administered 2013-05-14: 2 via ORAL
  Filled 2013-05-14: qty 2

## 2013-05-14 NOTE — ED Notes (Signed)
Ultrasound tech at bedside with female NT chaperone .

## 2013-05-14 NOTE — Discharge Instructions (Signed)
Take the antibiotics prescribed. Take 600mg  ibuprofen every 6 hours for pain control. Recommend warm compresses every 6-8 hours to promote drainage. Do not change your dressing for the first 24 hours. After 24 hours, change at least daily. Do not replace packing if packing falls out prior to 48 hour recheck.  Abscess An abscess is an infected area that contains a collection of pus and debris.It can occur in almost any part of the body. An abscess is also known as a furuncle or boil. CAUSES  An abscess occurs when tissue gets infected. This can occur from blockage of oil or sweat glands, infection of hair follicles, or a minor injury to the skin. As the body tries to fight the infection, pus collects in the area and creates pressure under the skin. This pressure causes pain. People with weakened immune systems have difficulty fighting infections and get certain abscesses more often.  SYMPTOMS Usually an abscess develops on the skin and becomes a painful mass that is red, warm, and tender. If the abscess forms under the skin, you may feel a moveable soft area under the skin. Some abscesses break open (rupture) on their own, but most will continue to get worse without care. The infection can spread deeper into the body and eventually into the bloodstream, causing you to feel ill.  DIAGNOSIS  Your caregiver will take your medical history and perform a physical exam. A sample of fluid may also be taken from the abscess to determine what is causing your infection. TREATMENT  Your caregiver may prescribe antibiotic medicines to fight the infection. However, taking antibiotics alone usually does not cure an abscess. Your caregiver may need to make a small cut (incision) in the abscess to drain the pus. In some cases, gauze is packed into the abscess to reduce pain and to continue draining the area. HOME CARE INSTRUCTIONS   Only take over-the-counter or prescription medicines for pain, discomfort, or fever as  directed by your caregiver.  If you were prescribed antibiotics, take them as directed. Finish them even if you start to feel better.  If gauze is used, follow your caregiver's directions for changing the gauze.  To avoid spreading the infection:  Keep your draining abscess covered with a bandage.  Wash your hands well.  Do not share personal care items, towels, or whirlpools with others.  Avoid skin contact with others.  Keep your skin and clothes clean around the abscess.  Keep all follow-up appointments as directed by your caregiver. SEEK MEDICAL CARE IF:   You have increased pain, swelling, redness, fluid drainage, or bleeding.  You have muscle aches, chills, or a general ill feeling.  You have a fever. MAKE SURE YOU:   Understand these instructions.  Will watch your condition.  Will get help right away if you are not doing well or get worse. Document Released: 01/16/2005 Document Revised: 10/08/2011 Document Reviewed: 06/21/2011 [facility] Patient Information 2014 [place], [place].

## 2013-05-14 NOTE — ED Provider Notes (Signed)
Medical screening examination/treatment/procedure(s) were performed by non-physician practitioner and as supervising physician I was immediately available for consultation/collaboration.    Olivia Mackielga M Quanta Roher, MD 05/14/13 [id number]

## 2013-08-25 ENCOUNTER — Encounter (HOSPITAL_COMMUNITY): Payer: Self-pay | Admitting: Emergency Medicine

## 2013-08-25 ENCOUNTER — Emergency Department (HOSPITAL_COMMUNITY)
Admission: EM | Admit: 2013-08-25 | Discharge: 2013-08-25 | Disposition: A | Discharge status: Home / Self Care | Payer: 59 | Source: Home / Self Care | Attending: Family Medicine | Admitting: Family Medicine

## 2013-08-25 ENCOUNTER — Emergency Department (INDEPENDENT_AMBULATORY_CARE_PROVIDER_SITE_OTHER): Payer: 59

## 2013-08-25 DIAGNOSIS — M65839 Other synovitis and tenosynovitis, unspecified forearm: Secondary | ICD-10-CM

## 2013-08-25 DIAGNOSIS — M65849 Other synovitis and tenosynovitis, unspecified hand: Secondary | ICD-10-CM

## 2013-08-25 DIAGNOSIS — M778 Other enthesopathies, not elsewhere classified: Secondary | ICD-10-CM

## 2013-08-25 MED ORDER — PREDNISONE 10 MG PO KIT: PACK | ORAL | Status: DC

## 2013-08-25 NOTE — Discharge Instructions (Signed)
Thank you for coming in today. Follow up with Dr. Farris HasKramer if not getting better.  Take the prednisone as directed.

## 2013-08-25 NOTE — ED Provider Notes (Signed)
Martha Nash is a 23 y.o. female who presents to Urgent Care today for left wrist pain. Pain present for 2 days. Patient denies any injury. 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 She is right-handed. Pain is present on the radial to dorsal aspect of the wrist. Pain worse with wrist motion and thumb motion. No radiating pain weakness or numbness. No fevers chills nausea vomiting or diarrhea. She's tried some NSAIDs which have helped a bit. She feels well otherwise.   Past Medical History  Diagnosis Date  . Abscess of breast, right 11/30/2012   History  Substance Use Topics  . Smoking status: Current Every Day Smoker    Types: Cigarettes  . Smokeless tobacco: Not on file  . Alcohol Use: No   ROS as above Medications: No current facility-administered medications for this encounter.   Current Outpatient Prescriptions  Medication Sig Dispense Refill  . naproxen sodium (ANAPROX) 220 MG tablet Take 440 mg by mouth 2 (two) times daily as needed (for pain).      . PredniSONE 10 MG KIT 12 day dose pack po  1 kit  0    Exam:  BP 110/69  Pulse 98  Temp(Src) 98.9 F (37.2 C) (Oral)  Resp 18  SpO2 100%  LMP 08/25/2013 Gen: Well NAD Left wrist: Normal-appearing Mildly tender at the radial styloid and across the dorsum of the wrist to the ulnar aspect. Normal wrist motion. Negative Finkelstein's or Tinel's test or Phalen's test.  Capillary refill sensation and pulses are intact  No results found for this or any previous visit (from the past 24 hour(s)). Dg Wrist Complete Left  08/25/2013   CLINICAL DATA:  WRIST PAIN WRIST PAIN pain across the left wrist.  EXAM: LEFT WRIST - COMPLETE 3+ VIEW  COMPARISON:  None.  FINDINGS: There is no evidence of fracture or dislocation. There is no evidence of arthropathy or other focal bone abnormality. Soft tissues are unremarkable.  IMPRESSION: Negative.   Electronically Signed   By: Dereck Ligas M.D.   On: 08/25/2013 18:36     Assessment and Plan: 23 y.o. female with wrist tendinitis. Likely secondary to overuse injury. Plan to treat with prednisone and thumb spica splint for a week. Followup with sports medicine if not improving.  Discussed warning signs or symptoms. Please see discharge instructions. Patient expresses understanding.    Gregor Hams, MD 08/25/13 [phone]

## 2013-08-25 NOTE — ED Notes (Signed)
Left wrist pain and swelling.  Symptoms noticed Monday after working.  Patient cuts meat, is right handed.  Radial pulse 2 plus and cap refill brisk

## 2014-02-05 ENCOUNTER — Emergency Department (HOSPITAL_COMMUNITY)
Admission: EM | Admit: 2014-02-05 | Discharge: 2014-02-05 | Disposition: A | Discharge status: Home / Self Care | Payer: 59 | Source: Home / Self Care | Attending: Family Medicine | Admitting: Family Medicine

## 2014-02-05 ENCOUNTER — Encounter (HOSPITAL_COMMUNITY): Payer: Self-pay | Admitting: Emergency Medicine

## 2014-02-05 ENCOUNTER — Other Ambulatory Visit (HOSPITAL_COMMUNITY)
Admission: RE | Admit: 2014-02-05 | Discharge: 2014-02-05 | Disposition: A | Discharge status: Home / Self Care | Payer: 59 | Source: Ambulatory Visit | Attending: Family Medicine | Admitting: Family Medicine

## 2014-02-05 DIAGNOSIS — N760 Acute vaginitis: Secondary | ICD-10-CM | POA: Insufficient documentation

## 2014-02-05 DIAGNOSIS — N730 Acute parametritis and pelvic cellulitis: Secondary | ICD-10-CM

## 2014-02-05 DIAGNOSIS — Z113 Encounter for screening for infections with a predominantly sexual mode of transmission: Secondary | ICD-10-CM | POA: Insufficient documentation

## 2014-02-05 LAB — POCT URINALYSIS DIP (DEVICE)
Bilirubin Urine: NEGATIVE
Glucose, UA: NEGATIVE mg/dL
HGB URINE DIPSTICK: NEGATIVE
KETONES UR: NEGATIVE mg/dL
Leukocytes, UA: NEGATIVE
Nitrite: NEGATIVE
PH: 6.5 (ref 5.0–8.0)
PROTEIN: NEGATIVE mg/dL
SPECIFIC GRAVITY, URINE: 1.025 (ref 1.005–1.030)
UROBILINOGEN UA: 1 mg/dL (ref 0.0–1.0)

## 2014-02-05 LAB — POCT PREGNANCY, URINE: Preg Test, Ur: NEGATIVE

## 2014-02-05 MED ORDER — CEFTRIAXONE SODIUM 250 MG IJ SOLR
INTRAMUSCULAR | Status: AC
  Filled 2014-02-05: qty 250

## 2014-02-05 MED ORDER — LIDOCAINE HCL (PF) 1 % IJ SOLN
INTRAMUSCULAR | Status: AC
  Filled 2014-02-05: qty 5

## 2014-02-05 MED ORDER — CEFTRIAXONE SODIUM 250 MG IJ SOLR
250.0000 mg | Freq: Once | INTRAMUSCULAR | Status: AC
  Administered 2014-02-05: 250 mg via INTRAMUSCULAR

## 2014-02-05 MED ORDER — DOXYCYCLINE HYCLATE 100 MG PO CAPS: 100.0000 mg | ORAL_CAPSULE | Freq: Two times a day (BID) | ORAL | Status: DC

## 2014-02-05 MED ORDER — METRONIDAZOLE 500 MG PO TABS: 500.0000 mg | ORAL_TABLET | Freq: Two times a day (BID) | ORAL | Status: DC

## 2014-02-05 NOTE — Discharge Instructions (Signed)
Pelvic Inflammatory Disease °Pelvic inflammatory disease (PID) refers to an infection in some or all of the female organs. The infection can be in the uterus, ovaries, fallopian tubes, or the surrounding tissues in the pelvis. PID can cause abdominal or pelvic pain that comes on suddenly (acute pelvic pain). PID is a serious infection because it can lead to lasting (chronic) pelvic pain or the inability to have children (infertile).  °CAUSES  °The infection is often caused by the normal bacteria found in the vaginal tissues. PID may also be caused by an infection that is spread during sexual contact. PID can also occur following:  °· The birth of a baby.   °· A miscarriage.   °· An abortion.   °· Major pelvic surgery.   °· The use of an intrauterine device (IUD).   °· A sexual assault.   °RISK FACTORS °Certain factors can put a person at higher risk for PID, such as: °· Being younger than 25 years. °· Being sexually active at a young age. °· Using nonbarrier contraception. °· Having multiple sexual partners. °· Having sex with someone who has symptoms of a genital infection. °· Using oral contraception. °Other times, certain behaviors can increase the possibility of getting PID, such as: °· Having sex during your period. °· Using a vaginal douche. °· Having an intrauterine device (IUD) in place. °SYMPTOMS  °· Abdominal or pelvic pain.   °· Fever.   °· Chills.   °· Abnormal vaginal discharge. °· Abnormal uterine bleeding.   °· Unusual pain shortly after finishing your period. °DIAGNOSIS  °Your caregiver will choose some of the following methods to make a diagnosis, such as:  °· Performing a physical exam and history. A pelvic exam typically reveals a very tender uterus and surrounding pelvis.   °· Ordering laboratory tests including a pregnancy test, blood tests, and urine test.  °· Ordering cultures of the vagina and cervix to check for a sexually transmitted infection (STI). °· Performing an ultrasound.    °· Performing a laparoscopic procedure to look inside the pelvis.   °TREATMENT  °· Antibiotic medicines may be prescribed and taken by mouth.   °· Sexual partners may be treated when the infection is caused by a sexually transmitted disease (STD).   °· Hospitalization may be needed to give antibiotics intravenously. °· Surgery may be needed, but this is rare. °It may take weeks until you are completely well. If you are diagnosed with PID, you should also be checked for human immunodeficiency virus (HIV).   °HOME CARE INSTRUCTIONS  °· If given, take your antibiotics as directed. Finish the medicine even if you start to feel better.   °· Only take over-the-counter or prescription medicines for pain, discomfort, or fever as directed by your caregiver.   °· Do not have sexual intercourse until treatment is completed or as directed by your caregiver. If PID is confirmed, your recent sexual partner(s) will need treatment.   °· Keep your follow-up appointments. °SEEK MEDICAL CARE IF:  °· You have increased or abnormal vaginal discharge.   °· You need prescription medicine for your pain.   °· You vomit.   °· You cannot take your medicines.   °· Your partner has an STD.   °SEEK IMMEDIATE MEDICAL CARE IF:  °· You have a fever.   °· You have increased abdominal or pelvic pain.   °· You have chills.   °· You have pain when you urinate.   °· You are not better after 72 hours following treatment.   °MAKE SURE YOU:  °· Understand these instructions. °· Will watch your condition. °· Will get help right away if you are not doing well or get worse. °  Document Released: 04/08/2005 Document Revised: 08/03/2012 Document Reviewed: 04/04/2011 °Exit[employer]® Patient Information ©2015 [employer]. This information is not intended to replace advice given to you by your health care provider. Make sure you discuss any questions you have with your health care provider. ° °

## 2014-02-05 NOTE — ED Notes (Signed)
C/O intermittent sharp abd pain that started across low abd 2 wks ago.  Pain has since moved to right mid abd, occasionally feels crampy, and has increased in intensity.  Denies any n/v/d, fevers, vaginal discharge.  States pain improves when she applies pressure to area.  Has been applying heating pad; has not taken any meds.

## 2014-02-05 NOTE — ED Provider Notes (Signed)
CSN: [id number]     Arrival date & time 02/05/14  1454 History   First MD Initiated Contact with Patient 02/05/14 1517     Chief Complaint  Patient presents with  . Abdominal Pain   (Consider location/radiation/quality/duration/timing/severity/associated sxs/prior Treatment) HPI Comments: +Sexually active Reports she uses condoms Reports herself to be otherwise healthy.   Patient is a 23 y.o. female presenting with abdominal pain. The history is provided by the patient.  Abdominal Pain Pain location:  Suprapubic, RLQ and LLQ Pain quality: cramping   Pain radiates to:  RUQ Pain severity:  Mild Onset quality:  Gradual Duration:  2 weeks Timing:  Intermittent Progression:  Waxing and waning Chronicity:  New Relieved by:  Nothing Worsened by:  Nothing tried Ineffective treatments:  None tried Associated symptoms: no anorexia, no belching, no chest pain, no chills, no constipation, no cough, no diarrhea, no dysuria, no fatigue, no fever, no flatus, no hematemesis, no hematochezia, no hematuria, no melena, no nausea, no shortness of breath, no sore throat, no vaginal bleeding, no vaginal discharge and no vomiting   Risk factors: no alcohol abuse, has not had multiple surgeries, no NSAID use, not pregnant and no recent hospitalization     Past Medical History  Diagnosis Date  . Abscess of breast, right 11/30/2012   Past Surgical History  Procedure Laterality Date  . Anterior cruciate ligament repair    . Irrigation and debridement abscess Right 11/29/2012    Procedure: IRRIGATION AND DEBRIDEMENT RIGHT BREAST ABSCESS;  Surgeon: Zenovia Jarred, MD;  Location: [place];  Service: General;  Laterality: Right;   No family history on file. History  Substance Use Topics  . Smoking status: Current Every Day Smoker    Types: Cigarettes  . Smokeless tobacco: Not on file  . Alcohol Use: Yes     Comment: occasionally   OB History   Grav Para Term Preterm Abortions TAB SAB Ect Mult Living                  Review of Systems  Constitutional: Negative for fever, chills and fatigue.  HENT: Negative for sore throat.   Respiratory: Negative for cough and shortness of breath.   Cardiovascular: Negative for chest pain.  Gastrointestinal: Positive for abdominal pain. Negative for nausea, vomiting, diarrhea, constipation, melena, hematochezia, anorexia, flatus and hematemesis.  Genitourinary: Negative for dysuria, hematuria, vaginal bleeding and vaginal discharge.    Allergies  Review of patient's allergies indicates no known allergies.  Home Medications   Prior to Admission medications   Medication Sig Start Date End Date Taking? Authorizing Provider  doxycycline (VIBRAMYCIN) 100 MG capsule Take 1 capsule (100 mg total) by mouth 2 (two) times daily. X 14 days 02/05/14   Audelia Hives Monica Zahler, PA  metroNIDAZOLE (FLAGYL) 500 MG tablet Take 1 tablet (500 mg total) by mouth 2 (two) times daily. X 14 days 02/05/14   Audelia Hives Ashiya Kinkead, PA  naproxen sodium (ANAPROX) 220 MG tablet Take 440 mg by mouth 2 (two) times daily as needed (for pain).    Historical Provider, MD  PredniSONE 10 MG KIT 12 day dose pack po 08/25/13   Gregor Hams, MD   BP 126/68  Pulse 80  Temp(Src) 98.4 F (36.9 C) (Oral)  Resp 16  SpO2 98%  LMP 01/13/2014 Physical Exam  Nursing note and vitals reviewed. Constitutional: She appears well-developed and well-nourished. No distress.  HENT:  Head: Normocephalic and atraumatic.  Eyes: Conjunctivae are normal. No scleral  icterus.  Cardiovascular: Normal rate, regular rhythm and normal heart sounds.   Pulmonary/Chest: Effort normal and breath sounds normal.  Abdominal: Soft. Normal appearance and bowel sounds are normal. She exhibits no mass. There is no hepatosplenomegaly. There is tenderness in the right upper quadrant, right lower quadrant, suprapubic area and left lower quadrant. There is no rigidity, no rebound, no guarding and no CVA tenderness.   Genitourinary: Uterus normal. Pelvic exam was performed with patient supine. There is no rash, tenderness or lesion on the right labia. There is no rash, tenderness or lesion on the left labia. Cervix exhibits motion tenderness, discharge and friability. Right adnexum displays tenderness. Right adnexum displays no mass and no fullness. Left adnexum displays tenderness. Left adnexum displays no mass and no fullness. No erythema, tenderness or bleeding around the vagina. No foreign body around the vagina. No signs of injury around the vagina. Vaginal discharge found.  Moderate yellow foul smelling discharge  Musculoskeletal: Normal range of motion.  Skin: Skin is warm and dry. No rash noted. No erythema.  Psychiatric: She has a normal mood and affect. Her behavior is normal.    ED Course  Procedures (including critical care time) Labs Review Labs Reviewed  POCT URINALYSIS DIP (DEVICE)  POCT PREGNANCY, URINE  CERVICOVAGINAL ANCILLARY ONLY    Imaging Review No results found.   MDM   1. PID (acute pelvic inflammatory disease)   Cervicovaginal swabs sent for testing and patient [facility] I suspect patient is experiencing PID with associated cervicitis. Question if radiation of discomfort to RUQ is related to perihepatitis (Fitz-Hugh-Curtis syndrome) from PID. Vital signs stable and patient appears non-toxic. Mild pelvic discomfort during bimanual exam, but exam does not suggest TOA or acute appendicitis. I did advise patient that if symptoms or pain worsen while on treatment, or if she develop fever, nausea or vomiting, she needs to report to her nearest ER for re-evaluation. Will treat as outpatient for PID with 14 days of flagyl and doxycycline.    JLutricia Feil PUtah10/17/15 [phone]

## 2014-02-05 NOTE — ED Notes (Signed)
S/S allergic reaction reviewed w/ pt. 

## 2014-02-07 LAB — CERVICOVAGINAL ANCILLARY ONLY
Chlamydia: POSITIVE — AB
Neisseria Gonorrhea: NEGATIVE
WET PREP (BD AFFIRM): NEGATIVE
Wet Prep (BD Affirm): NEGATIVE
Wet Prep (BD Affirm): POSITIVE — AB

## 2014-02-09 ENCOUNTER — Telehealth (HOSPITAL_COMMUNITY): Payer: Self-pay | Admitting: *Deleted

## 2014-02-09 NOTE — ED Provider Notes (Signed)
Medical screening examination/treatment/procedure(s) were performed by resident physician or non-physician practitioner and as supervising physician I was immediately available for consultation/collaboration.   Merlean Pizzini DOUGLAS MD.   Ladarien Beeks D Desmond Tufano, MD 02/09/14 2011 

## 2014-02-09 NOTE — ED Notes (Addendum)
I called pt. and left a message to call.  Call 1. [name], Martha Nash 02/09/2014 Pt. called back.  Pt. verified x 2 and given results.  Pt.told she was adequately treated for Chlamydia with Doxycycline and bacterial vaginosis with Flagyl.  Pt. instructed to finish all of the medications.  Pt. instructed to notify her partner to get treated for Chlamydia, no sex for 1 week after the medication and to practice safe sex. Pt. told [facility] can [facility], by appointment.  Pt. voiced understanding.  [facility]HS [facility]. [name], Martha Nash 02/09/2014

## 2014-02-09 NOTE — ED Notes (Signed)
GC neg., Chlamydia pos., Affirm: Candida and Trich neg., Gardnerella pos. Pt. Adequately treated with Doxycycline and Flagyl. [name], Chisom Aust M 02/09/2014

## 2014-07-28 ENCOUNTER — Emergency Department (HOSPITAL_COMMUNITY): Payer: Self-pay

## 2014-07-28 ENCOUNTER — Encounter (HOSPITAL_COMMUNITY): Payer: Self-pay | Admitting: Emergency Medicine

## 2014-07-28 ENCOUNTER — Emergency Department (HOSPITAL_COMMUNITY)
Admission: EM | Admit: 2014-07-28 | Discharge: 2014-07-28 | Disposition: A | Discharge status: Home / Self Care | Payer: Self-pay | Attending: Emergency Medicine | Admitting: Emergency Medicine

## 2014-07-28 DIAGNOSIS — R1011 Right upper quadrant pain: Secondary | ICD-10-CM | POA: Insufficient documentation

## 2014-07-28 DIAGNOSIS — Z720 Tobacco use: Secondary | ICD-10-CM | POA: Insufficient documentation

## 2014-07-28 DIAGNOSIS — Z8742 Personal history of other diseases of the female genital tract: Secondary | ICD-10-CM | POA: Insufficient documentation

## 2014-07-28 DIAGNOSIS — Z791 Long term (current) use of non-steroidal anti-inflammatories (NSAID): Secondary | ICD-10-CM | POA: Insufficient documentation

## 2014-07-28 DIAGNOSIS — Z792 Long term (current) use of antibiotics: Secondary | ICD-10-CM | POA: Insufficient documentation

## 2014-07-28 DIAGNOSIS — R61 Generalized hyperhidrosis: Secondary | ICD-10-CM | POA: Insufficient documentation

## 2014-07-28 DIAGNOSIS — R1013 Epigastric pain: Secondary | ICD-10-CM | POA: Insufficient documentation

## 2014-07-28 DIAGNOSIS — Z7952 Long term (current) use of systemic steroids: Secondary | ICD-10-CM | POA: Insufficient documentation

## 2014-07-28 DIAGNOSIS — R109 Unspecified abdominal pain: Secondary | ICD-10-CM

## 2014-07-28 DIAGNOSIS — Z3202 Encounter for pregnancy test, result negative: Secondary | ICD-10-CM | POA: Insufficient documentation

## 2014-07-28 DIAGNOSIS — R112 Nausea with vomiting, unspecified: Secondary | ICD-10-CM | POA: Insufficient documentation

## 2014-07-28 LAB — DIFFERENTIAL
BASOS ABS: 0 10*3/uL (ref 0.0–0.1)
BASOS PCT: 0 % (ref 0–1)
Eosinophils Absolute: 0.1 10*3/uL (ref 0.0–0.7)
Eosinophils Relative: 1 % (ref 0–5)
Lymphocytes Relative: 24 % (ref 12–46)
Lymphs Abs: 1.8 10*3/uL (ref 0.7–4.0)
MONO ABS: 0.3 10*3/uL (ref 0.1–1.0)
Monocytes Relative: 5 % (ref 3–12)
NEUTROS ABS: 5.1 10*3/uL (ref 1.7–7.7)
NEUTROS PCT: 70 % (ref 43–77)

## 2014-07-28 LAB — CBC
HCT: 40.7 % (ref 36.0–46.0)
HEMOGLOBIN: 13.9 g/dL (ref 12.0–15.0)
MCH: 28.4 pg (ref 26.0–34.0)
MCHC: 34.2 g/dL (ref 30.0–36.0)
MCV: 83.2 fL (ref 78.0–100.0)
Platelets: 295 10*3/uL (ref 150–400)
RBC: 4.89 MIL/uL (ref 3.87–5.11)
RDW: 13.5 % (ref 11.5–15.5)
WBC: 7.5 10*3/uL (ref 4.0–10.5)

## 2014-07-28 LAB — COMPREHENSIVE METABOLIC PANEL
ALK PHOS: 80 U/L (ref 39–117)
ALT: 14 U/L (ref 0–35)
ANION GAP: 12 (ref 5–15)
AST: 26 U/L (ref 0–37)
Albumin: 3.6 g/dL (ref 3.5–5.2)
BUN: 6 mg/dL (ref 6–23)
CHLORIDE: 108 mmol/L (ref 96–112)
CO2: 21 mmol/L (ref 19–32)
Calcium: 9.2 mg/dL (ref 8.4–10.5)
Creatinine, Ser: 0.89 mg/dL (ref 0.50–1.10)
GFR calc Af Amer: >90 mL/min (ref >90)
GFR calc non Af Amer: 89 mL/min — ABNORMAL LOW (ref >90)
Glucose, Bld: 113 mg/dL — ABNORMAL HIGH (ref 70–99)
POTASSIUM: 3.4 mmol/L — AB (ref 3.5–5.1)
SODIUM: 141 mmol/L (ref 135–145)
Total Bilirubin: 0.6 mg/dL (ref 0.3–1.2)
Total Protein: 7 g/dL (ref 6.0–8.3)

## 2014-07-28 LAB — I-STAT TROPONIN, ED: Troponin i, poc: 0 ng/mL (ref 0.00–0.08)

## 2014-07-28 LAB — LIPASE, BLOOD: Lipase: 16 U/L (ref 11–59)

## 2014-07-28 LAB — POC URINE PREG, ED: Preg Test, Ur: NEGATIVE

## 2014-07-28 MED ORDER — METOCLOPRAMIDE HCL 10 MG PO TABS: 10.0000 mg | ORAL_TABLET | Freq: Four times a day (QID) | ORAL | Status: DC | PRN

## 2014-07-28 MED ORDER — HYDROMORPHONE HCL 1 MG/ML IJ SOLN
0.5000 mg | Freq: Once | INTRAMUSCULAR | Status: AC
  Administered 2014-07-28: 0.5 mg via INTRAVENOUS
  Filled 2014-07-28: qty 1

## 2014-07-28 MED ORDER — PANTOPRAZOLE SODIUM 40 MG IV SOLR
40.0000 mg | Freq: Once | INTRAVENOUS | Status: AC
  Administered 2014-07-28: 40 mg via INTRAVENOUS
  Filled 2014-07-28: qty 40

## 2014-07-28 MED ORDER — OXYCODONE-ACETAMINOPHEN 5-325 MG PO TABS: 1.0000 | ORAL_TABLET | ORAL | Status: DC | PRN

## 2014-07-28 MED ORDER — ONDANSETRON HCL 4 MG/2ML IJ SOLN
4.0000 mg | Freq: Once | INTRAMUSCULAR | Status: AC
  Administered 2014-07-28: 4 mg via INTRAVENOUS
  Filled 2014-07-28: qty 2

## 2014-07-28 MED ORDER — HYDROMORPHONE HCL 1 MG/ML IJ SOLN
1.0000 mg | Freq: Once | INTRAMUSCULAR | Status: AC
  Administered 2014-07-28: 1 mg via INTRAVENOUS
  Filled 2014-07-28: qty 1

## 2014-07-28 NOTE — ED Provider Notes (Signed)
CSN: [id number]     Arrival date & time 07/28/14  0309 History   First MD Initiated Contact with Patient 07/28/14 0350     This chart was scribed for Delora Fuel, MD by Forrestine Him, ED Scribe. This patient was seen in room A08C/A08C and the patient's care was started 3:51 AM.   Chief Complaint  Patient presents with  . Chest Pain    HPI  HPI Comments: Martha Nash is a 24 y.o. female without any pertinent past medical history who presents to the Emergency Department complaining of constant, sudden onset epigastric abdominal pain onset just prior to arrival. Pain is described as sharp and currently rated 10/10. She also reports SOB and 1 episode of vomiting. No OTC medications or home remedies taken prior to arrival. No recent fever or chills. Ms. Fadely admits to eating a small amount of fish this morning and consuming alcohol- 3 shots and 1 mixed drink. Pt denies any previous pregnancies and does not have any children. No previous stomach ulcers. Pt is currently on her menses.  No known allergies to medication.  No PCP  Past Medical History  Diagnosis Date  . Abscess of breast, right 11/30/2012   Past Surgical History  Procedure Laterality Date  . Anterior cruciate ligament repair    . Irrigation and debridement abscess Right 11/29/2012    Procedure: IRRIGATION AND DEBRIDEMENT RIGHT BREAST ABSCESS;  Surgeon: Zenovia Jarred, MD;  Location: [place];  Service: General;  Laterality: Right;   No family history on file. History  Substance Use Topics  . Smoking status: Current Every Day Smoker    Types: Cigarettes  . Smokeless tobacco: Not on file  . Alcohol Use: Yes     Comment: occasionally   OB History    No data available     Review of Systems  Constitutional: Positive for diaphoresis. Negative for fever and chills.  Gastrointestinal: Positive for nausea, vomiting and abdominal pain.  All other systems reviewed and are negative.     Allergies  Review of patient's  allergies indicates no known allergies.  Home Medications   Prior to Admission medications   Medication Sig Start Date End Date Taking? Authorizing Provider  naproxen sodium (ANAPROX) 220 MG tablet Take 440 mg by mouth 2 (two) times daily as needed (for pain).   Yes Historical Provider, MD  doxycycline (VIBRAMYCIN) 100 MG capsule Take 1 capsule (100 mg total) by mouth 2 (two) times daily. X 14 days Patient not taking: Reported on 07/28/2014 02/05/14   Audelia Hives Presson, PA  metroNIDAZOLE (FLAGYL) 500 MG tablet Take 1 tablet (500 mg total) by mouth 2 (two) times daily. X 14 days Patient not taking: Reported on 07/28/2014 02/05/14   Audelia Hives Presson, PA  PredniSONE 10 MG KIT 12 day dose pack po Patient not taking: Reported on 07/28/2014 08/25/13   Gregor Hams, MD   Triage Vitals: BP 139/87 mmHg  Pulse 84  Temp(Src) 98 F (36.7 C) (Oral)  Resp 20  SpO2 100%   Physical Exam  Constitutional: She is oriented to person, place, and time. She appears well-developed and well-nourished. No distress.  Appears uncomfortable  HENT:  Head: Normocephalic and atraumatic.  Eyes: EOM are normal.  Neck: Normal range of motion.  Cardiovascular: Normal rate, regular rhythm and normal heart sounds.   Pulmonary/Chest: Effort normal and breath sounds normal.  Abdominal: Soft. She exhibits no distension. There is no tenderness.  Moderate tenderness to epigastrium and RUQ Bowel  sounds are decreased  Musculoskeletal: Normal range of motion.  Neurological: She is alert and oriented to person, place, and time.  Skin: Skin is warm and dry.  Psychiatric: She has a normal mood and affect. Judgment normal.  Nursing note and vitals reviewed.   ED Course  Procedures (including critical care time)  DIAGNOSTIC STUDIES: Oxygen Saturation is 100% on RA, Normal by my interpretation.    COORDINATION OF CARE: 3:50 AM-Discussed treatment plan with pt at bedside and pt agreed to plan.     Labs  Review Results for orders placed or performed during the hospital encounter of 07/28/14  CBC  Result Value Ref Range   WBC 7.5 4.0 - 10.5 K/uL   RBC 4.89 3.87 - 5.11 MIL/uL   Hemoglobin 13.9 12.0 - 15.0 g/dL   HCT 40.7 36.0 - 46.0 %   MCV 83.2 78.0 - 100.0 fL   MCH 28.4 26.0 - 34.0 pg   MCHC 34.2 30.0 - 36.0 g/dL   RDW 13.5 11.5 - 15.5 %   Platelets 295 150 - 400 K/uL  Comprehensive metabolic panel  Result Value Ref Range   Sodium 141 135 - 145 mmol/L   Potassium 3.4 (L) 3.5 - 5.1 mmol/L   Chloride 108 96 - 112 mmol/L   CO2 21 19 - 32 mmol/L   Glucose, Bld 113 (H) 70 - 99 mg/dL   BUN 6 6 - 23 mg/dL   Creatinine, Ser 0.89 0.50 - 1.10 mg/dL   Calcium 9.2 8.4 - 10.5 mg/dL   Total Protein 7.0 6.0 - 8.3 g/dL   Albumin 3.6 3.5 - 5.2 g/dL   AST 26 0 - 37 U/L   ALT 14 0 - 35 U/L   Alkaline Phosphatase 80 39 - 117 U/L   Total Bilirubin 0.6 0.3 - 1.2 mg/dL   GFR calc non Af Amer 89 (L) >90 mL/min   GFR calc Af Amer >90 >90 mL/min   Anion gap 12 5 - 15  Lipase, blood  Result Value Ref Range   Lipase 16 11 - 59 U/L  Differential  Result Value Ref Range   Neutrophils Relative % 70 43 - 77 %   Neutro Abs 5.1 1.7 - 7.7 K/uL   Lymphocytes Relative 24 12 - 46 %   Lymphs Abs 1.8 0.7 - 4.0 K/uL   Monocytes Relative 5 3 - 12 %   Monocytes Absolute 0.3 0.1 - 1.0 K/uL   Eosinophils Relative 1 0 - 5 %   Eosinophils Absolute 0.1 0.0 - 0.7 K/uL   Basophils Relative 0 0 - 1 %   Basophils Absolute 0.0 0.0 - 0.1 K/uL  I-stat troponin, ED (not at [facility])  Result Value Ref Range   Troponin i, poc 0.00 0.00 - 0.08 ng/mL   Comment 3          POC Urine Pregnancy, ED (pre-menopausal females) - do not order at [facility]  Result Value Ref Range   Preg Test, Ur NEGATIVE NEGATIVE   Imaging Review US Abdomen Complete  07/28/2014   CLINICAL DATA:  Epigastric pain for 5 hours.  EXAM: ULTRASOUND ABDOMEN COMPLETE  COMPARISON:  None.  FINDINGS: Gallbladder: No gallstones or wall thickening visualized. No  sonographic Murphy sign noted.  Common bile duct: Diameter: 3.8 mm, normal  Liver: No focal lesion identified. Within normal limits in parenchymal echogenicity.  IVC: No abnormality visualized.  Pancreas: Visualized portion unremarkable.  Spleen: Size and appearance within normal limits.  Right Kidney: Length: 11.6  cm. Echogenicity within normal limits. No mass or hydronephrosis visualized.  Left Kidney: Length: 11.1 cm. Echogenicity within normal limits. No mass or hydronephrosis visualized.  Abdominal aorta: No aneurysm visualized.  Other findings: None.  IMPRESSION: No acute abnormalities demonstrated.  Normal examination.   Electronically Signed   By: Lucienne Capers M.D.   On: 07/28/2014 06:30     EKG Interpretation   Date/Time:  Thursday July 28 2014 03:14:19 EDT Ventricular Rate:  76 PR Interval:  135 QRS Duration: 101 QT Interval:  404 QTC Calculation: 454 R Axis:   74 Text Interpretation:  Sinus rhythm Borderline Q waves in lateral leads  Minimal ST depression, inferior leads No old tracing to compare Confirmed  by [facility]  MD, Jovanni Rash ([id number]) on 07/28/2014 3:50:50 AM      MDM   Final diagnoses:  Abdominal pain, unspecified abdominal location    Upper abdominal pain of uncertain cause. IV is started and she is given hydromorphone for pain and ondansetron for nausea, as well as a dose of pantoprazole. She had significant relief with this. Laboratory workup was unremarkable with normal WBC and normal lipase and normal liver functions. She is sent for ultrasound of abdomen which shows no evidence of cholelithiasis. Patient is reevaluated and tenderness is much improved. She is discharged with prescriptions for oxycodone have acetaminophen and metoclopramide and she is advised to use over-the-counter omeprazole. [facility]  I personally performed the services described in this documentation, which was scribed in my presence. The  recorded information has been reviewed and is accurate.     Delora Fuel, MD 00/45/99 [id number]

## 2014-07-28 NOTE — ED Notes (Signed)
Sudden onset epigastric pain with one episode of vomiting this am, started 1 hour ago.  Patient tearful during triage, unable to hold still.

## 2014-07-28 NOTE — ED Notes (Signed)
Dr. Preston FleetingGlick informed of patient's pain and vomiting.  Zofran ordered.  To see patient

## 2014-07-28 NOTE — Discharge Instructions (Signed)
Take omeprazole (Prilosec OTC) once a day. Return if symptoms are getting worse.  Abdominal Pain Many things can cause abdominal pain. Usually, abdominal pain is not caused by a disease and will improve without treatment. It can often be observed and treated at home. Your health care provider will do a physical exam and possibly order blood tests and X-rays to help determine the seriousness of your pain. However, in many cases, more time must pass before a clear cause of the pain can be found. Before that point, your health care provider may not know if you need more testing or further treatment. HOME CARE INSTRUCTIONS  Monitor your abdominal pain for any changes. The following actions may help to alleviate any discomfort you are experiencing:  Only take over-the-counter or prescription medicines as directed by your health care provider.  Do not take laxatives unless directed to do so by your health care provider.  Try a clear liquid diet (broth, tea, or water) as directed by your health care provider. Slowly move to a bland diet as tolerated. SEEK MEDICAL CARE IF:  You have unexplained abdominal pain.  You have abdominal pain associated with nausea or diarrhea.  You have pain when you urinate or have a bowel movement.  You experience abdominal pain that wakes you in the night.  You have abdominal pain that is worsened or improved by eating food.  You have abdominal pain that is worsened with eating fatty foods.  You have a fever. SEEK IMMEDIATE MEDICAL CARE IF:   Your pain does not go away within 2 hours.  You keep throwing up (vomiting).  Your pain is felt only in portions of the abdomen, such as the right side or the left lower portion of the abdomen.  You pass bloody or black tarry stools. MAKE SURE YOU:  Understand these instructions.   Will watch your condition.   Will get help right away if you are not doing well or get worse.  Document Released: 01/16/2005  Document Revised: 04/13/2013 Document Reviewed: 12/16/2012 [facility] Patient Information 2015 [place], [place]. This information is not intended to replace advice given to you by your health care provider. Make sure you discuss any questions you have with your health care provider.  Acetaminophen; Oxycodone tablets What is this medicine? ACETAMINOPHEN; OXYCODONE (a set a MEE noe fen; ox i KOE done) is a pain reliever. It is used to treat mild to moderate pain. This medicine may be used for other purposes; ask your health care provider or pharmacist if you have questions. COMMON BRAND NAME(S): Endocet, Magnacet, Narvox, Percocet, Perloxx, Primalev, Primlev, Roxicet, Xolox What should I tell my health care provider before I take this medicine? They need to know if you have any of these conditions: -brain tumor -Crohn's disease, inflammatory bowel disease, or ulcerative colitis -drug abuse or addiction -head injury -heart or circulation problems -if you often drink alcohol -kidney disease or problems going to the bathroom -liver disease -lung disease, asthma, or breathing problems -an unusual or allergic reaction to acetaminophen, oxycodone, other opioid analgesics, other medicines, foods, dyes, or preservatives -pregnant or trying to get pregnant -breast-feeding How should I use this medicine? Take this medicine by mouth with a full glass of water. Follow the directions on the prescription label. Take your medicine at regular intervals. Do not take your medicine more often than directed. Talk to your pediatrician regarding the use of this medicine in children. Special care may be needed. Patients over 69 years old may have  a stronger reaction and need a smaller dose. Overdosage: If you think you have taken too much of this medicine contact a poison control center or emergency room at once. NOTE: This medicine is only for you. Do not share this medicine with others. What if I miss a dose? If  you miss a dose, take it as soon as you can. If it is almost time for your next dose, take only that dose. Do not take double or extra doses. What may interact with this medicine? -alcohol -antihistamines -barbiturates like amobarbital, butalbital, butabarbital, methohexital, pentobarbital, phenobarbital, thiopental, and secobarbital -benztropine -drugs for bladder problems like solifenacin, trospium, oxybutynin, tolterodine, hyoscyamine, and methscopolamine -drugs for breathing problems like ipratropium and tiotropium -drugs for certain stomach or intestine problems like propantheline, homatropine methylbromide, glycopyrrolate, atropine, belladonna, and dicyclomine -general anesthetics like etomidate, ketamine, nitrous oxide, propofol, desflurane, enflurane, halothane, isoflurane, and sevoflurane -medicines for depression, anxiety, or psychotic disturbances -medicines for sleep -muscle relaxants -naltrexone -narcotic medicines (opiates) for pain -phenothiazines like perphenazine, thioridazine, chlorpromazine, mesoridazine, fluphenazine, prochlorperazine, promazine, and trifluoperazine -scopolamine -tramadol -trihexyphenidyl This list may not describe all possible interactions. Give your health care provider a list of all the medicines, herbs, non-prescription drugs, or dietary supplements you use. Also tell them if you smoke, drink alcohol, or use illegal drugs. Some items may interact with your medicine. What should I watch for while using this medicine? Tell your doctor or health care professional if your pain does not go away, if it gets worse, or if you have new or a different type of pain. You may develop tolerance to the medicine. Tolerance means that you will need a higher dose of the medication for pain relief. Tolerance is normal and is expected if you take this medicine for a long time. Do not suddenly stop taking your medicine because you may develop a severe reaction. Your body  becomes used to the medicine. This does NOT mean you are addicted. Addiction is a behavior related to getting and using a drug for a non-medical reason. If you have pain, you have a medical reason to take pain medicine. Your doctor will tell you how much medicine to take. If your doctor wants you to stop the medicine, the dose will be slowly lowered over time to avoid any side effects. You may get drowsy or dizzy. Do not drive, use machinery, or do anything that needs mental alertness until you know how this medicine affects you. Do not stand or sit up quickly, especially if you are an older patient. This reduces the risk of dizzy or fainting spells. Alcohol may interfere with the effect of this medicine. Avoid alcoholic drinks. There are different types of narcotic medicines (opiates) for pain. If you take more than one type at the same time, you may have more side effects. Give your health care provider a list of all medicines you use. Your doctor will tell you how much medicine to take. Do not take more medicine than directed. Call emergency for help if you have problems breathing. The medicine will cause constipation. Try to have a bowel movement at least every 2 to 3 days. If you do not have a bowel movement for 3 days, call your doctor or health care professional. Do not take Tylenol (acetaminophen) or medicines that have acetaminophen with this medicine. Too much acetaminophen can be very dangerous. Many nonprescription medicines contain acetaminophen. Always read the labels carefully to avoid taking more acetaminophen. What side effects may I notice from receiving  this medicine? Side effects that you should report to your doctor or health care professional as soon as possible: -allergic reactions like skin rash, itching or hives, swelling of the face, lips, or tongue -breathing difficulties, wheezing -confusion -light headedness or fainting spells -severe stomach pain -unusually weak or  tired -yellowing of the skin or the whites of the eyes Side effects that usually do not require medical attention (report to your doctor or health care professional if they continue or are bothersome): -dizziness -drowsiness -nausea -vomiting This list may not describe all possible side effects. Call your doctor for medical advice about side effects. You may report side effects to FDA at 1-800-FDA-1088. Where should I keep my medicine? Keep out of the reach of children. This medicine can be abused. Keep your medicine in a safe place to protect it from theft. Do not share this medicine with anyone. Selling or giving away this medicine is dangerous and against the law. Store at room temperature between 20 and 25 degrees C (68 and 77 degrees F). Keep container tightly closed. Protect from light. This medicine may cause accidental overdose and death if it is taken by other adults, children, or pets. Flush any unused medicine down the toilet to reduce the chance of harm. Do not use the medicine after the expiration date. NOTE: This sheet is a summary. It may not cover all possible information. If you have questions about this medicine, talk to your doctor, pharmacist, or health care provider.  2015, [employer]. (2012-11-30 13:17:35)  Metoclopramide tablets What is this medicine? METOCLOPRAMIDE (met oh kloe PRA mide) is used to treat the symptoms of gastroesophageal reflux disease (GERD) like heartburn. It is also used to treat people with slow emptying of the stomach and intestinal tract. This medicine may be used for other purposes; ask your health care provider or pharmacist if you have questions. COMMON BRAND NAME(S): Reglan What should I tell my health care provider before I take this medicine? They need to know if you have any of these conditions: -breast cancer -depression -diabetes -heart failure -high blood pressure -kidney disease -liver disease -Parkinson's disease or a  movement disorder -pheochromocytoma -seizures -stomach obstruction, bleeding, or perforation -an unusual or allergic reaction to metoclopramide, procainamide, sulfites, other medicines, foods, dyes, or preservatives -pregnant or trying to get pregnant -breast-feeding How should I use this medicine? Take this medicine by mouth with a glass of water. Follow the directions on the prescription label. Take this medicine on an empty stomach, about 30 minutes before eating. Take your doses at regular intervals. Do not take your medicine more often than directed. Do not stop taking except on the advice of your doctor or health care professional. A special MedGuide will be given to you by the pharmacist with each prescription and refill. Be sure to read this information carefully each time. Talk to your pediatrician regarding the use of this medicine in children. Special care may be needed. Overdosage: If you think you have taken too much of this medicine contact a poison control center or emergency room at once. NOTE: This medicine is only for you. Do not share this medicine with others. What if I miss a dose? If you miss a dose, take it as soon as you can. If it is almost time for your next dose, take only that dose. Do not take double or extra doses. What may interact with this medicine? -acetaminophen -cyclosporine -digoxin -medicines for blood pressure -medicines for diabetes, including insulin -medicines for  hay fever and other allergies -medicines for depression, especially an Monoamine Oxidase Inhibitor (MAOI) -medicines for Parkinson's disease, like levodopa -medicines for sleep or for pain -tetracycline This list may not describe all possible interactions. Give your health care provider a list of all the medicines, herbs, non-prescription drugs, or dietary supplements you use. Also tell them if you smoke, drink alcohol, or use illegal drugs. Some items may interact with your  medicine. What should I watch for while using this medicine? It may take a few weeks for your stomach condition to start to get better. However, do not take this medicine for longer than 12 weeks. The longer you take this medicine, and the more you take it, the greater your chances are of developing serious side effects. If you are an elderly patient, a female patient, or you have diabetes, you may be at an increased risk for side effects from this medicine. Contact your doctor immediately if you start having movements you cannot control such as lip smacking, rapid movements of the tongue, involuntary or uncontrollable movements of the eyes, head, arms and legs, or muscle twitches and spasms. Patients and their families should watch out for worsening depression or thoughts of suicide. Also watch out for any sudden or severe changes in feelings such as feeling anxious, agitated, panicky, irritable, hostile, aggressive, impulsive, severely restless, overly excited and hyperactive, or not being able to sleep. If this happens, especially at the beginning of treatment or after a change in dose, call your doctor. Do not treat yourself for high fever. Ask your doctor or health care professional for advice. You may get drowsy or dizzy. Do not drive, use machinery, or do anything that needs mental alertness until you know how this drug affects you. Do not stand or sit up quickly, especially if you are an older patient. This reduces the risk of dizzy or fainting spells. Alcohol can make you more drowsy and dizzy. Avoid alcoholic drinks. What side effects may I notice from receiving this medicine? Side effects that you should report to your doctor or health care professional as soon as possible: -allergic reactions like skin rash, itching or hives, swelling of the face, lips, or tongue -abnormal production of milk in females -breast enlargement in both males and females -change in the way you walk -difficulty  moving, speaking or swallowing -drooling, lip smacking, or rapid movements of the tongue -excessive sweating -fever -involuntary or uncontrollable movements of the eyes, head, arms and legs -irregular heartbeat or palpitations -muscle twitches and spasms -unusually weak or tired Side effects that usually do not require medical attention (report to your doctor or health care professional if they continue or are bothersome): -change in sex drive or performance -depressed mood -diarrhea -difficulty sleeping -headache -menstrual changes -restless or nervous This list may not describe all possible side effects. Call your doctor for medical advice about side effects. You may report side effects to FDA at 1-800-FDA-1088. Where should I keep my medicine? Keep out of the reach of children. Store at room temperature between 20 and 25 degrees C (68 and 77 degrees F). Protect from light. Keep container tightly closed. Throw away any unused medicine after the expiration date. NOTE: This sheet is a summary. It may not cover all possible information. If you have questions about this medicine, talk to your doctor, pharmacist, or health care provider.  2015, [employer]. (2011-08-06 13:04:38)

## 2014-07-28 NOTE — ED Notes (Signed)
Patient transported to Ultrasound 

## 2014-07-28 NOTE — ED Notes (Signed)
Pt reports etoh use tonight and currently vomiting.

## 2014-10-03 ENCOUNTER — Encounter (HOSPITAL_COMMUNITY): Payer: Self-pay | Admitting: Emergency Medicine

## 2014-10-03 ENCOUNTER — Emergency Department (HOSPITAL_COMMUNITY)
Admission: EM | Admit: 2014-10-03 | Discharge: 2014-10-03 | Disposition: A | Discharge status: Home / Self Care | Payer: Self-pay | Attending: Emergency Medicine | Admitting: Emergency Medicine

## 2014-10-03 DIAGNOSIS — Z720 Tobacco use: Secondary | ICD-10-CM | POA: Insufficient documentation

## 2014-10-03 DIAGNOSIS — M545 Low back pain, unspecified: Secondary | ICD-10-CM

## 2014-10-03 DIAGNOSIS — Y9389 Activity, other specified: Secondary | ICD-10-CM | POA: Insufficient documentation

## 2014-10-03 DIAGNOSIS — Z792 Long term (current) use of antibiotics: Secondary | ICD-10-CM | POA: Insufficient documentation

## 2014-10-03 DIAGNOSIS — Y92410 Unspecified street and highway as the place of occurrence of the external cause: Secondary | ICD-10-CM | POA: Insufficient documentation

## 2014-10-03 DIAGNOSIS — S3992XA Unspecified injury of lower back, initial encounter: Secondary | ICD-10-CM | POA: Insufficient documentation

## 2014-10-03 DIAGNOSIS — Z872 Personal history of diseases of the skin and subcutaneous tissue: Secondary | ICD-10-CM | POA: Insufficient documentation

## 2014-10-03 DIAGNOSIS — S0990XA Unspecified injury of head, initial encounter: Secondary | ICD-10-CM | POA: Insufficient documentation

## 2014-10-03 DIAGNOSIS — Y998 Other external cause status: Secondary | ICD-10-CM | POA: Insufficient documentation

## 2014-10-03 MED ORDER — NAPROXEN 250 MG PO TABS: 250.0000 mg | ORAL_TABLET | Freq: Two times a day (BID) | ORAL | Status: DC

## 2014-10-03 NOTE — ED Notes (Signed)
Pt verbalizes understanding of d/c instructions and denies any further need at this time. 
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
## 2015-08-01 ENCOUNTER — Encounter (HOSPITAL_COMMUNITY): Payer: Self-pay | Admitting: Emergency Medicine

## 2015-08-01 ENCOUNTER — Emergency Department (HOSPITAL_COMMUNITY)
Admission: EM | Admit: 2015-08-01 | Discharge: 2015-08-01 | Disposition: A | Discharge status: Home / Self Care | Payer: No Typology Code available for payment source | Attending: Emergency Medicine | Admitting: Emergency Medicine

## 2015-08-01 ENCOUNTER — Other Ambulatory Visit (HOSPITAL_COMMUNITY): Payer: Self-pay | Admitting: *Deleted

## 2015-08-01 DIAGNOSIS — N6310 Unspecified lump in the right breast, unspecified quadrant: Secondary | ICD-10-CM

## 2015-08-01 DIAGNOSIS — N611 Abscess of the breast and nipple: Secondary | ICD-10-CM | POA: Insufficient documentation

## 2015-08-01 DIAGNOSIS — F17210 Nicotine dependence, cigarettes, uncomplicated: Secondary | ICD-10-CM | POA: Insufficient documentation

## 2015-08-01 MED ORDER — CLINDAMYCIN HCL 300 MG PO CAPS: 300.0000 mg | ORAL_CAPSULE | Freq: Once | ORAL | Status: DC

## 2015-08-01 MED ORDER — HYDROCODONE-ACETAMINOPHEN 5-325 MG PO TABS
1.0000 | ORAL_TABLET | Freq: Once | ORAL | Status: AC
  Administered 2015-08-01: 1 via ORAL
  Filled 2015-08-01: qty 1

## 2015-08-01 MED ORDER — DOXYCYCLINE HYCLATE 100 MG PO TABS
100.0000 mg | ORAL_TABLET | Freq: Once | ORAL | Status: AC
  Administered 2015-08-01: 100 mg via ORAL
  Filled 2015-08-01: qty 1

## 2015-08-01 MED ORDER — HYDROCODONE-ACETAMINOPHEN 5-325 MG PO TABS: 2.0000 | ORAL_TABLET | ORAL | Status: DC | PRN

## 2015-08-01 MED ORDER — DOXYCYCLINE HYCLATE 100 MG PO TABS: 100.0000 mg | ORAL_TABLET | Freq: Two times a day (BID) | ORAL | Status: DC

## 2015-08-01 NOTE — ED Provider Notes (Signed)
CSN: [id number]     Arrival date & time 08/01/15  0024 History   First MD Initiated Contact with Patient 08/01/15 0413     Chief Complaint  Patient presents with  . Breast Pain     (Consider location/radiation/quality/duration/timing/severity/associated sxs/prior Treatment) HPI Comments: For the past week patient has had right breast pain, redness and burning, stinging sensation behind her [place].  This is progressively gotten worse, tender to touch.  She denies any fever. She states that she's had an abscess in this breast.  In the past.  The history is provided by the patient.    Past Medical History  Diagnosis Date  . Abscess of breast, right 11/30/2012   Past Surgical History  Procedure Laterality Date  . Anterior cruciate ligament repair    . Irrigation and debridement abscess Right 11/29/2012    Procedure: IRRIGATION AND DEBRIDEMENT RIGHT BREAST ABSCESS;  Surgeon: Zenovia Jarred, MD;  Location: [facility];  Service: General;  Laterality: Right;   Family History  Problem Relation Age of Onset  . Diabetes Other    Social History  Substance Use Topics  . Smoking status: Current Every Day Smoker    Types: Cigarettes  . Smokeless tobacco: None  . Alcohol Use: Yes     Comment: occasionally   OB History    No data available     Review of Systems  Constitutional: Negative for fever and chills.  Respiratory: Negative for shortness of breath.   Cardiovascular: Negative for chest pain.  Skin: Positive for color change. Negative for wound.  All other systems reviewed and are negative.     Allergies  Review of patient's allergies indicates no known allergies.  Home Medications   Prior to Admission medications   Medication Sig Start Date End Date Taking? Authorizing Provider  naproxen sodium (ANAPROX) 220 MG tablet Take 440 mg by mouth 2 (two) times daily as needed (for pain).   Yes Historical Provider, MD  doxycycline (VIBRA-TABS) 100 MG tablet Take 1 tablet (100 mg  total) by mouth 2 (two) times daily. 08/01/15   Junius Creamer, NP  HYDROcodone-acetaminophen (NORCO/VICODIN) 5-325 MG tablet Take 2 tablets by mouth every 4 (four) hours as needed. 08/01/15   Junius Creamer, NP  metoCLOPramide (REGLAN) 10 MG tablet Take 1 tablet (10 mg total) by mouth every 6 (six) hours as needed for nausea. Patient not taking: Reported on 08/01/2015 10/27/44   Delora Fuel, MD  metroNIDAZOLE (FLAGYL) 500 MG tablet Take 1 tablet (500 mg total) by mouth 2 (two) times daily. X 14 days Patient not taking: Reported on 07/28/2014 02/05/14   Lutricia Feil, PA  naproxen (NAPROSYN) 250 MG tablet Take 1 tablet (250 mg total) by mouth 2 (two) times daily with a meal. Patient not taking: Reported on 08/01/2015 10/03/14   Waynetta Pean, PA-C  oxyCODONE-acetaminophen (PERCOCET) 5-325 MG per tablet Take 1 tablet by mouth every 4 (four) hours as needed for moderate pain. Patient not taking: Reported on 08/01/2015 12/27/27   Delora Fuel, MD  PredniSONE 10 MG KIT 12 day dose pack po Patient not taking: Reported on 07/28/2014 08/25/13   Gregor Hams, MD   BP 124/73 mmHg  Pulse 75  Temp(Src) 98.4 F (36.9 C) (Oral)  Resp 18  SpO2 100%  LMP 07/16/2015 (Exact Date) Physical Exam  Constitutional: She appears well-developed and well-nourished.  HENT:  Head: Normocephalic.  Eyes: Pupils are equal, round, and reactive to light.  Neck: Normal range of motion.  Cardiovascular:  Normal rate.   Pulmonary/Chest: Effort normal. Right breast exhibits inverted nipple, skin change and tenderness. Right breast exhibits no mass and no nipple discharge.    Abdominal: Soft.  Musculoskeletal: Normal range of motion.  Neurological: She is alert.  Skin: Skin is warm. There is erythema.  Nursing note and vitals reviewed.   ED Course  Procedures (including critical care time) Labs Review Labs Reviewed - No data to display  Imaging Review No results found. I have personally reviewed and evaluated these images  and lab results as part of my medical decision-making.   EKG Interpretation None      MDM   Final diagnoses:  Abscess of breast, right         Junius Creamer, NP 83/29/19 [id number]  Delora Fuel, MD 60/04/59 [id number]

## 2015-08-01 NOTE — Discharge Instructions (Signed)
You have been started on antibiotic and given a prescription for pain control.  Please call the breast imaging Center, first thing in the morning to schedule an appointment

## 2015-08-01 NOTE — ED Notes (Signed)
Pt is c/o pain to her right breast  Pt states it is a burning stinging pain  Pt states it started about a week ago and has gotten worse tonight  Pt states it is tender to touch

## 2015-08-10 ENCOUNTER — Ambulatory Visit (HOSPITAL_COMMUNITY): Payer: No Typology Code available for payment source

## 2015-08-10 ENCOUNTER — Other Ambulatory Visit (HOSPITAL_COMMUNITY): Payer: Self-pay | Admitting: Physician Assistant

## 2015-08-10 ENCOUNTER — Inpatient Hospital Stay: Admission: RE | Admit: 2015-08-10 | Payer: Self-pay | Source: Ambulatory Visit

## 2015-08-10 DIAGNOSIS — N644 Mastodynia: Secondary | ICD-10-CM

## 2016-07-10 ENCOUNTER — Emergency Department (HOSPITAL_COMMUNITY)
Admission: EM | Admit: 2016-07-10 | Discharge: 2016-07-10 | Disposition: A | Discharge status: Home / Self Care | Payer: Self-pay | Attending: Emergency Medicine | Admitting: Emergency Medicine

## 2016-07-10 ENCOUNTER — Encounter (HOSPITAL_COMMUNITY): Payer: Self-pay | Admitting: Emergency Medicine

## 2016-07-10 DIAGNOSIS — Z79899 Other long term (current) drug therapy: Secondary | ICD-10-CM | POA: Insufficient documentation

## 2016-07-10 DIAGNOSIS — N644 Mastodynia: Secondary | ICD-10-CM | POA: Insufficient documentation

## 2016-07-10 DIAGNOSIS — F17210 Nicotine dependence, cigarettes, uncomplicated: Secondary | ICD-10-CM | POA: Insufficient documentation

## 2016-07-10 MED ORDER — IBUPROFEN 800 MG PO TABS: 800.0000 mg | ORAL_TABLET | Freq: Three times a day (TID) | ORAL | 0 refills | Status: DC

## 2016-07-10 MED ORDER — AMOXICILLIN-POT CLAVULANATE 875-125 MG PO TABS: 1.0000 | ORAL_TABLET | Freq: Two times a day (BID) | ORAL | 0 refills | Status: DC

## 2016-07-10 NOTE — ED Provider Notes (Signed)
[facility] DEPT Provider Note   CSN: [id number] Arrival date & time: 07/10/16  1636  History   Chief Complaint Chief Complaint  Patient presents with  . Chest Pain  . Breast Pain    HPI Martha Nash is a 26 y.o. female who presents to the Emergency Department with constant right breast pain, redness and swelling that has worsened over the last 3 days. She reports she was treated about one year ago for a cyst to the right breast that ruptured before it could be assessed by general surgery. Today, she denies fever, chills, abdominal pain, right-sided breast pain, nipple discharge,or rash. LMP was three weeks ago. She is not currently lactating or breast feeding. She denies treatment prior to arrival.   No chronic medical conditions. No daily medications. NKA. She is a current every day smoker.   HPI  Past Medical History:  Diagnosis Date  . Abscess of breast, right 11/30/2012    Patient Active Problem List   Diagnosis Date Noted  . Abscess of breast, right 11/30/2012    Past Surgical History:  Procedure Laterality Date  . ANTERIOR CRUCIATE LIGAMENT REPAIR    . IRRIGATION AND DEBRIDEMENT ABSCESS Right 11/29/2012   Procedure: IRRIGATION AND DEBRIDEMENT RIGHT BREAST ABSCESS;  Surgeon: Zenovia Jarred, MD;  Location: [facility];  Service: General;  Laterality: Right;    OB History    No data available     Home Medications    Prior to Admission medications   Medication Sig Start Date End Date Taking? Authorizing Provider  amoxicillin-clavulanate (AUGMENTIN) 875-125 MG tablet Take 1 tablet by mouth every 12 (twelve) hours. 07/10/16   Christabel Camire Ophelia Charter, PA-C  doxycycline (VIBRA-TABS) 100 MG tablet Take 1 tablet (100 mg total) by mouth 2 (two) times daily. 08/01/15   Junius Creamer, NP  HYDROcodone-acetaminophen (NORCO/VICODIN) 5-325 MG tablet Take 2 tablets by mouth every 4 (four) hours as needed. 08/01/15   Junius Creamer, NP  ibuprofen (ADVIL,MOTRIN) 800 MG tablet Take 1 tablet (800  mg total) by mouth 3 (three) times daily. 07/10/16   Makaley Storts Ophelia Charter, PA-C  metoCLOPramide (REGLAN) 10 MG tablet Take 1 tablet (10 mg total) by mouth every 6 (six) hours as needed for nausea. Patient not taking: Reported on 08/01/2015 11/27/89   Delora Fuel, MD  metroNIDAZOLE (FLAGYL) 500 MG tablet Take 1 tablet (500 mg total) by mouth 2 (two) times daily. X 14 days Patient not taking: Reported on 07/28/2014 02/05/14   Lutricia Feil, PA  naproxen (NAPROSYN) 250 MG tablet Take 1 tablet (250 mg total) by mouth 2 (two) times daily with a meal. Patient not taking: Reported on 08/01/2015 10/03/14   Waynetta Pean, PA-C  naproxen sodium (ANAPROX) 220 MG tablet Take 440 mg by mouth 2 (two) times daily as needed (for pain).    Historical Provider, MD  oxyCODONE-acetaminophen (PERCOCET) 5-325 MG per tablet Take 1 tablet by mouth every 4 (four) hours as needed for moderate pain. Patient not taking: Reported on 08/01/2015 09/29/43   Delora Fuel, MD  PredniSONE 10 MG KIT 12 day dose pack po Patient not taking: Reported on 07/28/2014 08/25/13   Gregor Hams, MD    Family History Family History  Problem Relation Age of Onset  . Diabetes Other     Social History Social History  Substance Use Topics  . Smoking status: Current Every Day Smoker    Types: Cigarettes  . Smokeless tobacco: Not on file  . Alcohol use Yes  Comment: occasionally     Allergies   Patient has no known allergies.   Review of Systems Review of Systems  Constitutional: Negative for chills and fever.  Respiratory: Negative for chest tightness and shortness of breath.   Cardiovascular: Positive for chest pain (right breast).  Gastrointestinal: Negative for abdominal pain, diarrhea, nausea and vomiting.  Musculoskeletal: Negative for back pain.  Skin: Positive for color change. Negative for rash.   Physical Exam Updated Vital Signs BP 111/69   Pulse 81   Temp 98 F (36.7 C)   Resp 18   SpO2 100%   Physical Exam    Constitutional: She appears well-developed and well-nourished. No distress.  HENT:  Head: Normocephalic and atraumatic.  Eyes: Conjunctivae are normal.  Neck: Neck supple.  Cardiovascular: Normal rate, regular rhythm and normal heart sounds.  Exam reveals no gallop and no friction rub.   No murmur heard. Pulmonary/Chest: Effort normal and breath sounds normal. No respiratory distress. She has no wheezes. She has no rales. Right breast exhibits tenderness. Right breast exhibits no inverted nipple and no nipple discharge. Left breast exhibits no nipple discharge. There is breast swelling.  Chaperoned breast exam. Exquisitely tender to palpation of the right breast. Difficult to assess for underlying masses secondary to pain. No nipple discharge. The overlying skin is erythematous, edematous, and warm. No involvement of the left breast.   Abdominal: Soft. Bowel sounds are normal. She exhibits no distension. There is no tenderness.  Skin: She is not diaphoretic.  Nursing note and vitals reviewed.  ED Treatments / Results  Labs (all labs ordered are listed, but only abnormal results are displayed) Labs Reviewed - No data to display  EKG  EKG Interpretation None      Radiology No results found.  Procedures Procedures (including critical care time)  Medications Ordered in ED Medications - No data to display  Initial Impression / Assessment and Plan / ED Course  I have reviewed the triage vital signs and the nursing notes.  Pertinent labs & imaging results that were available during my care of the patient were reviewed by me and considered in my medical decision making (see chart for details).     26 y.o. female with 2-3 days of worsening right breast pain, redness, and swelling. She has a h/o of a cyst to the right breast that spontaneously rupture one year ago. She was not evaluated by general surgery at that time. Discussed findings, treatment, and follow up with the patient.  Will discharge to home with antibiotics and recommended follow up to general surgery and [facility] once symptoms improve. Patient advised of return precautions. Patient verbalized understanding and agreed with plan.  Final Clinical Impressions(s) / ED Diagnoses   Final diagnoses:  Breast tenderness in female    New Prescriptions Discharge Medication List as of 07/10/2016  6:04 PM    START taking these medications   Details  amoxicillin-clavulanate (AUGMENTIN) 875-125 MG tablet Take 1 tablet by mouth every 12 (twelve) hours., Starting Wed 07/10/2016, Print    ibuprofen (ADVIL,MOTRIN) 800 MG tablet Take 1 tablet (800 mg total) by mouth 3 (three) times daily., Starting Wed 07/10/2016, Print         Lilo Wallington Ophelia Charter, PA-C 07/10/16 2050    Tanna Furry, MD 07/24/16 [phone]

## 2016-07-10 NOTE — ED Notes (Signed)
After performing pt EKG she now admits that this feels like a breast cyst she had in the past. Pt also states now that she thinks about it it also feels warm.

## 2016-07-10 NOTE — ED Triage Notes (Signed)
Pt reports new onset right sided chest pain yesterday worse with movement that is constant. Denies any other symptoms.

## 2017-05-12 ENCOUNTER — Emergency Department (HOSPITAL_COMMUNITY)
Admission: EM | Admit: 2017-05-12 | Discharge: 2017-05-12 | Disposition: A | Discharge status: Home / Self Care | Payer: Self-pay | Attending: Emergency Medicine | Admitting: Emergency Medicine

## 2017-05-12 ENCOUNTER — Other Ambulatory Visit: Payer: Self-pay

## 2017-05-12 ENCOUNTER — Encounter (HOSPITAL_COMMUNITY): Payer: Self-pay | Admitting: Emergency Medicine

## 2017-05-12 DIAGNOSIS — F17210 Nicotine dependence, cigarettes, uncomplicated: Secondary | ICD-10-CM | POA: Insufficient documentation

## 2017-05-12 DIAGNOSIS — N611 Abscess of the breast and nipple: Secondary | ICD-10-CM | POA: Insufficient documentation

## 2017-05-12 DIAGNOSIS — Z79899 Other long term (current) drug therapy: Secondary | ICD-10-CM | POA: Insufficient documentation

## 2017-05-12 MED ORDER — IBUPROFEN 800 MG PO TABS: 800.0000 mg | ORAL_TABLET | Freq: Three times a day (TID) | ORAL | 0 refills | Status: DC

## 2017-05-12 MED ORDER — AMOXICILLIN-POT CLAVULANATE 875-125 MG PO TABS
1.0000 | ORAL_TABLET | Freq: Once | ORAL | Status: AC
  Administered 2017-05-12: 1 via ORAL
  Filled 2017-05-12: qty 1

## 2017-05-12 MED ORDER — AMOXICILLIN-POT CLAVULANATE 875-125 MG PO TABS: 1.0000 | ORAL_TABLET | Freq: Two times a day (BID) | ORAL | 0 refills | Status: DC

## 2017-05-12 NOTE — ED Triage Notes (Signed)
Pt c/o pain and burning around nipple of right breast x's 2 days ago.  Pt denies any drainage

## 2017-05-12 NOTE — ED Provider Notes (Signed)
[facility] EMERGENCY DEPARTMENT Provider Note   CSN: [id number] Arrival date & time: 05/12/17  1407     History   Chief Complaint Chief Complaint  Patient presents with  . Breast Pain    HPI Martha Nash is a 27 y.o. female.  The history is provided by the patient. No language interpreter was used.   Martha Nash is a 27 y.o. female who presents to the Emergency Department complaining of beast pain.  She reports several days of pain in the right breast.  She has local redness and swelling.  She is parents prior similar symptoms in the past and was treated with incision and drainage for abscess.  She denies any fevers, chills, systemic symptoms.  No chest pain, shortness of breath.  Symptoms are moderate and constant nature.  Past Medical History:  Diagnosis Date  . Abscess of breast, right 11/30/2012    Patient Active Problem List   Diagnosis Date Noted  . Abscess of breast, right 11/30/2012    Past Surgical History:  Procedure Laterality Date  . ANTERIOR CRUCIATE LIGAMENT REPAIR    . IRRIGATION AND DEBRIDEMENT ABSCESS Right 11/29/2012   Procedure: IRRIGATION AND DEBRIDEMENT RIGHT BREAST ABSCESS;  Surgeon: Zenovia Jarred, MD;  Location: [facility];  Service: General;  Laterality: Right;    OB History    No data available       Home Medications    Prior to Admission medications   Medication Sig Start Date End Date Taking? Authorizing Provider  Aspirin-Caffeine (BAYER BACK & BODY PAIN EX ST) 500-32.5 MG TABS Take 1-2 tablets by mouth every 6 (six) hours as needed (for pain).   Yes [provider]  amoxicillin-clavulanate (AUGMENTIN) 875-125 MG tablet Take 1 tablet by mouth every 12 (twelve) hours. 05/12/17   Quintella Reichert, MD  doxycycline (VIBRA-TABS) 100 MG tablet Take 1 tablet (100 mg total) by mouth 2 (two) times daily. Patient not taking: Reported on 05/12/2017 08/01/15   Junius Creamer, NP  HYDROcodone-acetaminophen (NORCO/VICODIN)  5-325 MG tablet Take 2 tablets by mouth every 4 (four) hours as needed. Patient not taking: Reported on 05/12/2017 08/01/15   Junius Creamer, NP  ibuprofen (ADVIL,MOTRIN) 800 MG tablet Take 1 tablet (800 mg total) by mouth 3 (three) times daily. 05/12/17   Quintella Reichert, MD  metoCLOPramide (REGLAN) 10 MG tablet Take 1 tablet (10 mg total) by mouth every 6 (six) hours as needed for nausea. Patient not taking: Reported on 6/81/1572 09/21/01   Delora Fuel, MD  metroNIDAZOLE (FLAGYL) 500 MG tablet Take 1 tablet (500 mg total) by mouth 2 (two) times daily. X 14 days Patient not taking: Reported on 05/12/2017 02/05/14   Presson, Audelia Hives, PA  naproxen (NAPROSYN) 250 MG tablet Take 1 tablet (250 mg total) by mouth 2 (two) times daily with a meal. Patient not taking: Reported on 05/12/2017 10/03/14   Waynetta Pean, PA-C  oxyCODONE-acetaminophen (PERCOCET) 5-325 MG per tablet Take 1 tablet by mouth every 4 (four) hours as needed for moderate pain. Patient not taking: Reported on 5/59/7416 06/28/43   Delora Fuel, MD  PredniSONE 10 MG KIT 12 day dose pack po Patient not taking: Reported on 05/12/2017 08/25/13   Gregor Hams, MD    Family History Family History  Problem Relation Age of Onset  . Diabetes Other     Social History Social History   Tobacco Use  . Smoking status: Current Every Day Smoker    Types: Cigarettes  . Smokeless  tobacco: Never Used  Substance Use Topics  . Alcohol use: Yes    Comment: occasionally  . Drug use: No     Allergies   Patient has no known allergies.   Review of Systems Review of Systems  All other systems reviewed and are negative.    Physical Exam Updated Vital Signs BP 113/70 (BP Location: Right Arm)   Pulse 89   Temp 98.6 F (37 C) (Oral)   Resp 16   Ht 5' 8.5" (1.74 m)   Wt 101.6 kg (224 lb)   LMP 05/12/2017 (Exact Date)   SpO2 100%   BMI 33.56 kg/m   Physical Exam  Constitutional: She is oriented to person, place, and time. She  appears well-developed and well-nourished.  HENT:  Head: Normocephalic and atraumatic.  Cardiovascular: Normal rate and regular rhythm.  Pulmonary/Chest: Effort normal. No respiratory distress.  Right breast with approximately 8 cm of erythema and induration.  There is a firm mass beneath the areola that is locally tender to palpation.  Mass is about 2-3 cm in size.  Musculoskeletal: Normal range of motion.  Neurological: She is alert and oriented to person, place, and time.  Skin: Skin is warm and dry. Capillary refill takes less than 2 seconds.  Psychiatric: She has a normal mood and affect. Her behavior is normal.  Nursing note and vitals reviewed.    ED Treatments / Results  Labs (all labs ordered are listed, but only abnormal results are displayed) Labs Reviewed - No data to display  EKG  EKG Interpretation None       Radiology No results found.  Procedures Procedures (including critical care time)  Medications Ordered in ED Medications  amoxicillin-clavulanate (AUGMENTIN) 875-125 MG per tablet 1 tablet (not administered)     Initial Impression / Assessment and Plan / ED Course  I have reviewed the triage vital signs and the nursing notes.  Pertinent labs & imaging results that were available during my care of the patient were reviewed by me and considered in my medical decision making (see chart for details).     Patient here for evaluation of right breast pain.  Examination is concerning with abscess with cellulitis.  She is nontoxic appearing on examination with no findings concerning for sepsis.  Discussed with patient home care for breast abscess with importance of breast center follow-up for further management.  Discussed close return precautions.  Final Clinical Impressions(s) / ED Diagnoses   Final diagnoses:  Breast abscess of female    ED Discharge Orders        Ordered    amoxicillin-clavulanate (AUGMENTIN) 875-125 MG tablet  Every 12 hours      05/12/17 1910    ibuprofen (ADVIL,MOTRIN) 800 MG tablet  3 times daily     05/12/17 1910       Quintella Reichert, MD 05/12/17 1918

## 2017-06-04 ENCOUNTER — Emergency Department (HOSPITAL_COMMUNITY)
Admission: EM | Admit: 2017-06-04 | Discharge: 2017-06-04 | Disposition: A | Discharge status: Home / Self Care | Payer: Self-pay | Attending: Emergency Medicine | Admitting: Emergency Medicine

## 2017-06-04 ENCOUNTER — Other Ambulatory Visit: Payer: Self-pay

## 2017-06-04 ENCOUNTER — Encounter (HOSPITAL_COMMUNITY): Payer: Self-pay | Admitting: *Deleted

## 2017-06-04 DIAGNOSIS — Z791 Long term (current) use of non-steroidal anti-inflammatories (NSAID): Secondary | ICD-10-CM | POA: Insufficient documentation

## 2017-06-04 DIAGNOSIS — L0291 Cutaneous abscess, unspecified: Secondary | ICD-10-CM

## 2017-06-04 DIAGNOSIS — F17210 Nicotine dependence, cigarettes, uncomplicated: Secondary | ICD-10-CM | POA: Insufficient documentation

## 2017-06-04 DIAGNOSIS — L02415 Cutaneous abscess of right lower limb: Secondary | ICD-10-CM | POA: Insufficient documentation

## 2017-06-04 MED ORDER — LIDOCAINE HCL 2 % IJ SOLN
20.0000 mL | Freq: Once | INTRAMUSCULAR | Status: AC
  Administered 2017-06-04: 400 mg
  Filled 2017-06-04: qty 20

## 2017-06-04 MED ORDER — SULFAMETHOXAZOLE-TRIMETHOPRIM 800-160 MG PO TABS: 1.0000 | ORAL_TABLET | Freq: Two times a day (BID) | ORAL | 0 refills | Status: DC

## 2017-06-04 MED ORDER — OXYCODONE-ACETAMINOPHEN 5-325 MG PO TABS
1.0000 | ORAL_TABLET | Freq: Once | ORAL | Status: AC
  Administered 2017-06-04: 1 via ORAL
  Filled 2017-06-04: qty 1

## 2017-06-04 MED ORDER — OXYCODONE-ACETAMINOPHEN 5-325 MG PO TABS: 2.0000 | ORAL_TABLET | ORAL | 0 refills | Status: DC | PRN

## 2017-06-04 MED ORDER — SULFAMETHOXAZOLE-TRIMETHOPRIM 800-160 MG PO TABS: 1.0000 | ORAL_TABLET | Freq: Two times a day (BID) | ORAL | 0 refills | Status: AC

## 2017-06-04 MED ORDER — OXYCODONE-ACETAMINOPHEN 5-325 MG PO TABS: 1.0000 | ORAL_TABLET | Freq: Four times a day (QID) | ORAL | 0 refills | Status: AC | PRN

## 2017-06-04 NOTE — ED Triage Notes (Signed)
Pt reports right knee pain and swelling for several days. Denies injury.

## 2017-06-04 NOTE — ED Provider Notes (Signed)
[facility] EMERGENCY DEPARTMENT Provider Note   CSN: [id number] Arrival date & time: 06/04/17  1339   History   Chief Complaint Chief Complaint  Patient presents with  . Knee Pain    HPI Martha Nash is a 27 y.o. female.  HPI    27 year old female presents today with complaints of infection to her right knee.  Patient notes over the last 3 days she has had worsening redness swelling and pain over the distal thigh and knee.  She denies any significant pain with flexion or extension of the knee, denies any swelling of the joint, no purulent discharge.  Patient reports that she has had abscesses in the past but have only been on her breast.  She denies any fever chills nausea or vomiting.   Past Medical History:  Diagnosis Date  . Abscess of breast, right 11/30/2012    Patient Active Problem List   Diagnosis Date Noted  . Abscess of breast, right 11/30/2012    Past Surgical History:  Procedure Laterality Date  . ANTERIOR CRUCIATE LIGAMENT REPAIR    . IRRIGATION AND DEBRIDEMENT ABSCESS Right 11/29/2012   Procedure: IRRIGATION AND DEBRIDEMENT RIGHT BREAST ABSCESS;  Surgeon: Zenovia Jarred, MD;  Location: [facility];  Service: General;  Laterality: Right;    OB History    No data available       Home Medications    Prior to Admission medications   Medication Sig Start Date End Date Taking? Authorizing Provider  amoxicillin-clavulanate (AUGMENTIN) 875-125 MG tablet Take 1 tablet by mouth every 12 (twelve) hours. 05/12/17   Quintella Reichert, MD  Aspirin-Caffeine (BAYER BACK & BODY PAIN EX ST) 500-32.5 MG TABS Take 1-2 tablets by mouth every 6 (six) hours as needed (for pain).    [provider]  doxycycline (VIBRA-TABS) 100 MG tablet Take 1 tablet (100 mg total) by mouth 2 (two) times daily. Patient not taking: Reported on 05/12/2017 08/01/15   Junius Creamer, NP  HYDROcodone-acetaminophen (NORCO/VICODIN) 5-325 MG tablet Take 2 tablets by mouth every 4  (four) hours as needed. Patient not taking: Reported on 05/12/2017 08/01/15   Junius Creamer, NP  ibuprofen (ADVIL,MOTRIN) 800 MG tablet Take 1 tablet (800 mg total) by mouth 3 (three) times daily. 05/12/17   Quintella Reichert, MD  metoCLOPramide (REGLAN) 10 MG tablet Take 1 tablet (10 mg total) by mouth every 6 (six) hours as needed for nausea. Patient not taking: Reported on 1/93/7902 4/0/97   Delora Fuel, MD  metroNIDAZOLE (FLAGYL) 500 MG tablet Take 1 tablet (500 mg total) by mouth 2 (two) times daily. X 14 days Patient not taking: Reported on 05/12/2017 02/05/14   Presson, Audelia Hives, PA  naproxen (NAPROSYN) 250 MG tablet Take 1 tablet (250 mg total) by mouth 2 (two) times daily with a meal. Patient not taking: Reported on 05/12/2017 10/03/14   Waynetta Pean, PA-C  oxyCODONE-acetaminophen (PERCOCET/ROXICET) 5-325 MG tablet Take 1 tablet by mouth every 6 (six) hours as needed for severe pain. 06/04/17   Ladashia Demarinis, Dellis Filbert, PA-C  PredniSONE 10 MG KIT 12 day dose pack po Patient not taking: Reported on 05/12/2017 08/25/13   Gregor Hams, MD  sulfamethoxazole-trimethoprim (BACTRIM DS,SEPTRA DS) 800-160 MG tablet Take 1 tablet by mouth 2 (two) times daily for 10 days. 06/04/17 06/14/17  Okey Regal, PA-C    Family History Family History  Problem Relation Age of Onset  . Diabetes Other     Social History Social History   Tobacco Use  .  Smoking status: Current Every Day Smoker    Types: Cigarettes  . Smokeless tobacco: Never Used  Substance Use Topics  . Alcohol use: Yes    Comment: occasionally  . Drug use: No     Allergies   Patient has no known allergies.   Review of Systems Review of Systems  All other systems reviewed and are negative.    Physical Exam Updated Vital Signs BP 131/84 (BP Location: Right Arm)   Pulse 93   Temp 98.7 F (37.1 C) (Oral)   Resp 16   LMP 05/12/2017 (Exact Date)   SpO2 100%   Physical Exam  Constitutional: She is oriented to person, place,  and time. She appears well-developed and well-nourished.  HENT:  Head: Normocephalic and atraumatic.  Eyes: Conjunctivae are normal. Pupils are equal, round, and reactive to light. Right eye exhibits no discharge. Left eye exhibits no discharge. No scleral icterus.  Neck: Normal range of motion. No JVD present. No tracheal deviation present.  Pulmonary/Chest: Effort normal. No stridor.  Musculoskeletal:  4 cm area of induration over the right knee in the superolateral patellar region -no purulent discharge, central fluctuance noted-full active range of motion of the knee, no joint effusion noted  Neurological: She is alert and oriented to person, place, and time. Coordination normal.  Psychiatric: She has a normal mood and affect. Her behavior is normal. Judgment and thought content normal.  Nursing note and vitals reviewed.   ED Treatments / Results  Labs (all labs ordered are listed, but only abnormal results are displayed) Labs Reviewed  AEROBIC/ANAEROBIC CULTURE (SURGICAL/DEEP WOUND)    EKG  EKG Interpretation None       Radiology No results found.  Procedures .Marland KitchenIncision and Drainage Date/Time: 06/04/2017 6:52 PM Performed by: Okey Regal, PA-C Authorized by: Okey Regal, PA-C   Consent:    Consent given by:  Patient   Risks discussed:  Bleeding, incomplete drainage, pain and infection   Alternatives discussed:  No treatment and alternative treatment Location:    Type:  Abscess   Size:  3   Location: right knee. Pre-procedure details:    Skin preparation:  Chloraprep Anesthesia (see MAR for exact dosages):    Anesthesia method:  Local infiltration   Local anesthetic:  Lidocaine 2% w/o epi Procedure type:    Complexity:  Simple Procedure details:    Incision types:  Single straight   Incision depth:  Dermal   Scalpel blade:  11   Wound management:  Probed and deloculated and irrigated with saline   Drainage:  Purulent   Drainage amount:  Moderate    Wound treatment:  Wound left open   Packing materials:  1/4 in gauze   Amount 1/4":  5  Post-procedure details:    Patient tolerance of procedure:  Tolerated well, no immediate complications    (including critical care time)  Medications Ordered in ED Medications  oxyCODONE-acetaminophen (PERCOCET/ROXICET) 5-325 MG per tablet 1 tablet (1 tablet Oral Given 06/04/17 1756)  lidocaine (XYLOCAINE) 2 % (with pres) injection 400 mg (400 mg Infiltration Given 06/04/17 1757)     Initial Impression / Assessment and Plan / ED Course  I have reviewed the triage vital signs and the nursing notes.  Pertinent labs & imaging results that were available during my care of the patient were reviewed by me and considered in my medical decision making (see chart for details).      Final Clinical Impressions(s) / ED Diagnoses   Final diagnoses:  Abscess  Labs: Wound culture  Imaging:  Consults:  Therapeutics: Bactrim, Percocet  Discharge Meds: Bactrim, Percocet  Assessment/Plan: 27 year old female presents today with an abscess to her right knee.  This appears right lateral, does not appear to have bursal involvement, no systemic illness or enter articular involvement.  Patient afebrile with no systemic symptoms.  I&D was successful, wound was cultured.  Patient placed on Bactrim, given a short course of pain medication.  She is given strict return precautions, she verbalized understanding and agreement to today's plan.    ED Discharge Orders        Ordered    oxyCODONE-acetaminophen (PERCOCET/ROXICET) 5-325 MG tablet  Every 4 hours PRN,   Status:  Discontinued     06/04/17 1856    sulfamethoxazole-trimethoprim (BACTRIM DS,SEPTRA DS) 800-160 MG tablet  2 times daily,   Status:  Discontinued     06/04/17 1856    oxyCODONE-acetaminophen (PERCOCET/ROXICET) 5-325 MG tablet  Every 6 hours PRN     06/04/17 1856    sulfamethoxazole-trimethoprim (BACTRIM DS,SEPTRA DS) 800-160 MG tablet  2 times  daily     06/04/17 1856       Okey Regal, PA-C 06/04/17 1858    Okey Regal, PA-C 06/04/17 1858    Pixie Casino, MD 06/04/17 1905

## 2017-06-04 NOTE — Discharge Instructions (Signed)
Please read the attached information.  Please remove packing in 36 hours-if you develop any worsening of symptoms please return immediately to the emergency room.  If symptoms still persist despite antibiotic use please again return.

## 2017-06-07 LAB — AEROBIC CULTURE W GRAM STAIN (SUPERFICIAL SPECIMEN)

## 2017-06-07 LAB — AEROBIC CULTURE  (SUPERFICIAL SPECIMEN)

## 2017-06-08 ENCOUNTER — Telehealth: Payer: Self-pay

## 2017-06-08 NOTE — Telephone Encounter (Signed)
Post ED Visit - Positive Culture Follow-up  Culture report reviewed by antimicrobial stewardship pharmacist:  []  Enzo BiNathan Batchelder, Pharm.D. []  Celedonio MiyamotoJeremy Frens, [address]Ph[address] BCPS AQ-ID [x]  Garvin FilaMike Maccia, Pharm.D., BCPS []  Georgina PillionElizabeth Martin, [address]Ph[address] BCPS []  MauriceMinh Pham, [address]Ph[address] BCPS, AAHIVP []  Estella HuskMichelle Turner, Pharm.D., BCPS, AAHIVP []  Lysle Pearlachel Rumbarger, PharmD, BCPS []  Blake DivineShannon Parkey, PharmD []  Pollyann SamplesAndy Johnston, PharmD, BCPS  Positive aerobic culture Treated with Sulfametjpxazole, organism sensitive to the same and no further patient follow-up is required at this time.  Jerry CarasCullom, Sanaiyah Kirchhoff Burnett 06/08/2017, 1:27 PM

## 2017-08-23 ENCOUNTER — Emergency Department (HOSPITAL_COMMUNITY)
Admission: EM | Admit: 2017-08-23 | Discharge: 2017-08-23 | Disposition: A | Discharge status: Home / Self Care | Payer: Self-pay | Attending: Emergency Medicine | Admitting: Emergency Medicine

## 2017-08-23 ENCOUNTER — Emergency Department (HOSPITAL_COMMUNITY): Payer: Self-pay

## 2017-08-23 ENCOUNTER — Encounter (HOSPITAL_COMMUNITY): Payer: Self-pay | Admitting: Emergency Medicine

## 2017-08-23 ENCOUNTER — Other Ambulatory Visit: Payer: Self-pay

## 2017-08-23 DIAGNOSIS — Z7982 Long term (current) use of aspirin: Secondary | ICD-10-CM | POA: Insufficient documentation

## 2017-08-23 DIAGNOSIS — F17210 Nicotine dependence, cigarettes, uncomplicated: Secondary | ICD-10-CM | POA: Insufficient documentation

## 2017-08-23 DIAGNOSIS — Z79899 Other long term (current) drug therapy: Secondary | ICD-10-CM | POA: Insufficient documentation

## 2017-08-23 DIAGNOSIS — M79671 Pain in right foot: Secondary | ICD-10-CM | POA: Insufficient documentation

## 2017-08-23 MED ORDER — IBUPROFEN 600 MG PO TABS: 600.0000 mg | ORAL_TABLET | Freq: Four times a day (QID) | ORAL | 0 refills | Status: AC | PRN

## 2017-08-23 NOTE — Discharge Instructions (Addendum)
Take ibuprofen as prescribed. Elevate to reduce swelling. Follow up with orthopedics (Dr. Roda Shutters) if symptoms are no better in 2-3 days.

## 2017-08-23 NOTE — ED Triage Notes (Signed)
Pt reports R foot pain X2 days, denies injury. Mild swelling noted.

## 2017-08-23 NOTE — ED Provider Notes (Signed)
[facility] EMERGENCY DEPARTMENT Provider Note   CSN: [id number] Arrival date & time: 08/23/17  0000     History   Chief Complaint Chief Complaint  Patient presents with  . Foot Pain    HPI Martha Nash is a 27 y.o. female.  Patient here for evaluation of right foot pain and swelling x 2 days. She reports twisting injury 3 months ago and intermittent symptoms of swelling and pain since with most recent episode 2 days ago without new injury. No lower leg pain, redness, fever.   The history is provided by the patient. No language interpreter was used.    Past Medical History:  Diagnosis Date  . Abscess of breast, right 11/30/2012    Patient Active Problem List   Diagnosis Date Noted  . Abscess of breast, right 11/30/2012    Past Surgical History:  Procedure Laterality Date  . ANTERIOR CRUCIATE LIGAMENT REPAIR    . IRRIGATION AND DEBRIDEMENT ABSCESS Right 11/29/2012   Procedure: IRRIGATION AND DEBRIDEMENT RIGHT BREAST ABSCESS;  Surgeon: Zenovia Jarred, MD;  Location: [facility];  Service: General;  Laterality: Right;     OB History   None      Home Medications    Prior to Admission medications   Medication Sig Start Date End Date Taking? Authorizing Provider  amoxicillin-clavulanate (AUGMENTIN) 875-125 MG tablet Take 1 tablet by mouth every 12 (twelve) hours. 05/12/17   Quintella Reichert, MD  Aspirin-Caffeine (BAYER BACK & BODY PAIN EX ST) 500-32.5 MG TABS Take 1-2 tablets by mouth every 6 (six) hours as needed (for pain).    [provider]  doxycycline (VIBRA-TABS) 100 MG tablet Take 1 tablet (100 mg total) by mouth 2 (two) times daily. Patient not taking: Reported on 05/12/2017 08/01/15   Junius Creamer, NP  HYDROcodone-acetaminophen (NORCO/VICODIN) 5-325 MG tablet Take 2 tablets by mouth every 4 (four) hours as needed. Patient not taking: Reported on 05/12/2017 08/01/15   Junius Creamer, NP  ibuprofen (ADVIL,MOTRIN) 800 MG tablet Take 1 tablet  (800 mg total) by mouth 3 (three) times daily. 05/12/17   Quintella Reichert, MD  metoCLOPramide (REGLAN) 10 MG tablet Take 1 tablet (10 mg total) by mouth every 6 (six) hours as needed for nausea. Patient not taking: Reported on 07/03/9700 09/22/76   Delora Fuel, MD  metroNIDAZOLE (FLAGYL) 500 MG tablet Take 1 tablet (500 mg total) by mouth 2 (two) times daily. X 14 days Patient not taking: Reported on 05/12/2017 02/05/14   Presson, Audelia Hives, PA  naproxen (NAPROSYN) 250 MG tablet Take 1 tablet (250 mg total) by mouth 2 (two) times daily with a meal. Patient not taking: Reported on 05/12/2017 10/03/14   Waynetta Pean, PA-C  oxyCODONE-acetaminophen (PERCOCET/ROXICET) 5-325 MG tablet Take 1 tablet by mouth every 6 (six) hours as needed for severe pain. 06/04/17   Hedges, Dellis Filbert, PA-C  PredniSONE 10 MG KIT 12 day dose pack po Patient not taking: Reported on 05/12/2017 08/25/13   Gregor Hams, MD    Family History Family History  Problem Relation Age of Onset  . Diabetes Other     Social History Social History   Tobacco Use  . Smoking status: Current Every Day Smoker    Types: Cigarettes  . Smokeless tobacco: Never Used  Substance Use Topics  . Alcohol use: Yes    Comment: occasionally  . Drug use: No     Allergies   Patient has no known allergies.   Review of Systems Review  of Systems  Constitutional: Negative for chills and fever.  Respiratory: Negative for shortness of breath.   Musculoskeletal:       See HPI.  Skin: Negative.  Negative for color change.  Neurological: Negative.  Negative for weakness and numbness.     Physical Exam Updated Vital Signs BP 124/82 (BP Location: Right Arm)   Pulse 81   Temp 98.1 F (36.7 C) (Oral)   Resp 16   Ht '5\' 8"'  (1.727 m)   Wt 102.1 kg (225 lb)   SpO2 99%   BMI 34.21 kg/m   Physical Exam  Constitutional: She is oriented to person, place, and time. She appears well-developed and well-nourished.  Neck: Normal range of  motion.  Cardiovascular: Intact distal pulses.  Pulmonary/Chest: Effort normal.  Musculoskeletal:  Right foot swollen dorsally. No warmth or redness. No skin breakdown or lesion. FROM all joints of the foot and ankle. Achilles intact. No calf pain or swelling.  Neurological: She is alert and oriented to person, place, and time.  Skin: Skin is warm and dry.     ED Treatments / Results  Labs (all labs ordered are listed, but only abnormal results are displayed) Labs Reviewed - No data to display  EKG None  Radiology Dg Foot Complete Right  Result Date: 08/23/2017 CLINICAL DATA:  27 year old female with right foot pain. EXAM: RIGHT FOOT COMPLETE - 3+ VIEW COMPARISON:  None. FINDINGS: There is no acute fracture or dislocation. The bones are well mineralized. No arthritic changes. Mild diffuse subcutaneous edema and mild soft tissue swelling of the forefoot. No radiopaque foreign object or soft tissue gas. IMPRESSION: 1. No acute osseous pathology. 2. Mild subcutaneous edema and swelling of the forefoot. Clinical correlation is recommended. Electronically Signed   By: Anner Crete M.D.   On: 08/23/2017 01:25    Procedures Procedures (including critical care time)  Medications Ordered in ED Medications - No data to display   Initial Impression / Assessment and Plan / ED Course  I have reviewed the triage vital signs and the nursing notes.  Pertinent labs & imaging results that were available during my care of the patient were reviewed by me and considered in my medical decision making (see chart for details).     Patient here with recurrent right foot pain and swelling. Imaging is negative. Will treat with supportive care and refer to orthopedics for evaluation if symptoms persist.   Final Clinical Impressions(s) / ED Diagnoses   Final diagnoses:  None   1. Right foot pain  ED Discharge Orders    None       Charlann Lange, PA-C 08/23/17 2224    Ward, Delice Bison,  DO 08/26/17 1332

## 2017-08-29 ENCOUNTER — Ambulatory Visit (INDEPENDENT_AMBULATORY_CARE_PROVIDER_SITE_OTHER): Payer: Self-pay | Admitting: Orthopaedic Surgery

## 2018-10-10 ENCOUNTER — Emergency Department (HOSPITAL_COMMUNITY): Payer: Self-pay

## 2018-10-10 ENCOUNTER — Emergency Department (HOSPITAL_COMMUNITY)
Admission: EM | Admit: 2018-10-10 | Discharge: 2018-10-10 | Disposition: A | Discharge status: Home / Self Care | Payer: Self-pay | Attending: Emergency Medicine | Admitting: Emergency Medicine

## 2018-10-10 ENCOUNTER — Other Ambulatory Visit: Payer: Self-pay

## 2018-10-10 DIAGNOSIS — M25562 Pain in left knee: Secondary | ICD-10-CM | POA: Insufficient documentation

## 2018-10-10 DIAGNOSIS — F17210 Nicotine dependence, cigarettes, uncomplicated: Secondary | ICD-10-CM | POA: Insufficient documentation

## 2018-10-10 DIAGNOSIS — W502XXA Accidental twist by another person, initial encounter: Secondary | ICD-10-CM | POA: Insufficient documentation

## 2018-10-10 NOTE — Discharge Instructions (Signed)
Take naproxen 2 times a day with meals. Take 2 over the counter pills at a time. Do not take other anti-inflammatories at the same time (Advil, Motrin, naproxen, Aleve). You may supplement with Tylenol if you need further pain control. Use ice packs, 20 minutes at a time for pain control.  Follow up with orthopedics in 1 week if your pain is not improving.  Return to the ER if you develop numbness, severe worsening pain, or any new, worsening, or concerning symptoms.

## 2018-10-10 NOTE — ED Provider Notes (Signed)
[facility] DEPT Provider Note   CSN: [id number] Arrival date & time: 10/10/18  1534    History   Chief Complaint Chief Complaint  Patient presents with  . Leg Injury    HPI Martha Nash is a 28 y.o. female presenting for evaluation of L knee pain.   Patient states last night she jumped over a fence but there is a hole in the other side of the fence.  When she landed, she twisted her ankle and felt her knee pop.  She reports acute onset pain since then.  Pain is at the anterior and lateral aspect of her left knee.  Pain is worse with ambulation and weightbearing.  She reports difficulty walking due to pain.  She denies injury elsewhere.  She did not hit her head.  She denies pain in her ankle or her hip.  She denies previous history of knee problems on this side.  She has no medical problems, takes medications daily.  She is not on blood thinners.  She does not have an orthopedic doctor.  Pain is mild at rest.  She is not taken anything for it including Tylenol ibuprofen.  She denies numbness or tingling.  She denies radiation of the pain.     HPI  Past Medical History:  Diagnosis Date  . Abscess of breast, right 11/30/2012    Patient Active Problem List   Diagnosis Date Noted  . Abscess of breast, right 11/30/2012    Past Surgical History:  Procedure Laterality Date  . ANTERIOR CRUCIATE LIGAMENT REPAIR    . IRRIGATION AND DEBRIDEMENT ABSCESS Right 11/29/2012   Procedure: IRRIGATION AND DEBRIDEMENT RIGHT BREAST ABSCESS;  Surgeon: Zenovia Jarred, MD;  Location: [facility];  Service: General;  Laterality: Right;     OB History   No obstetric history on file.      Home Medications    Prior to Admission medications   Medication Sig Start Date End Date Taking? Authorizing Provider  amoxicillin-clavulanate (AUGMENTIN) 875-125 MG tablet Take 1 tablet by mouth every 12 (twelve) hours. 05/12/17   Quintella Reichert, MD  Aspirin-Caffeine (BAYER BACK &  BODY PAIN EX ST) 500-32.5 MG TABS Take 1-2 tablets by mouth every 6 (six) hours as needed (for pain).    [provider]  doxycycline (VIBRA-TABS) 100 MG tablet Take 1 tablet (100 mg total) by mouth 2 (two) times daily. Patient not taking: Reported on 05/12/2017 08/01/15   Junius Creamer, NP  HYDROcodone-acetaminophen (NORCO/VICODIN) 5-325 MG tablet Take 2 tablets by mouth every 4 (four) hours as needed. Patient not taking: Reported on 05/12/2017 08/01/15   Junius Creamer, NP  ibuprofen (ADVIL,MOTRIN) 600 MG tablet Take 1 tablet (600 mg total) by mouth every 6 (six) hours as needed. 08/23/17   Charlann Lange, PA-C  metoCLOPramide (REGLAN) 10 MG tablet Take 1 tablet (10 mg total) by mouth every 6 (six) hours as needed for nausea. Patient not taking: Reported on 08/06/3843 06/25/44   Delora Fuel, MD  metroNIDAZOLE (FLAGYL) 500 MG tablet Take 1 tablet (500 mg total) by mouth 2 (two) times daily. X 14 days Patient not taking: Reported on 05/12/2017 02/05/14   Presson, Audelia Hives, PA  naproxen (NAPROSYN) 250 MG tablet Take 1 tablet (250 mg total) by mouth 2 (two) times daily with a meal. Patient not taking: Reported on 05/12/2017 10/03/14   Waynetta Pean, PA-C  oxyCODONE-acetaminophen (PERCOCET/ROXICET) 5-325 MG tablet Take 1 tablet by mouth every 6 (six) hours as needed for severe  pain. 06/04/17   Hedges, Dellis Filbert, PA-C  PredniSONE 10 MG KIT 12 day dose pack po Patient not taking: Reported on 05/12/2017 08/25/13   Gregor Hams, MD    Family History Family History  Problem Relation Age of Onset  . Diabetes Other     Social History Social History   Tobacco Use  . Smoking status: Current Every Day Smoker    Types: Cigarettes  . Smokeless tobacco: Never Used  Substance Use Topics  . Alcohol use: Yes    Comment: occasionally  . Drug use: No     Allergies   Patient has no known allergies.   Review of Systems Review of Systems  Musculoskeletal: Positive for arthralgias.  Neurological:  Negative for numbness.  Hematological: Does not bruise/bleed easily.     Physical Exam Updated Vital Signs BP 122/80 (BP Location: Right Arm)   Pulse 96   Temp 99 F (37.2 C) (Oral)   Resp 18   Ht '5\' 8"'  (1.727 m)   Wt 102.1 kg   LMP 09/23/2018   SpO2 99%   BMI 34.21 kg/m   Physical Exam Vitals signs and nursing note reviewed.  Constitutional:      General: She is not in acute distress.    Appearance: She is well-developed.  HENT:     Head: Normocephalic and atraumatic.  Neck:     Musculoskeletal: Normal range of motion.  Pulmonary:     Effort: Pulmonary effort is normal.  Abdominal:     General: There is no distension.  Musculoskeletal: Normal range of motion.        General: Tenderness present.     Comments: Is palpation along the medial and lateral joint line.  Minimal swelling.  No tenderness palpation over the patella patellar tendon, or posterior knee.  Pain with both varus and valgus stress.  No tenderness palpation of the calf or lower leg.  No tenderness palpation of the thigh.  Pedal pulses intact bilaterally.  Good distal cap refill.  Sensation of lower extremities intact.  Skin:    General: Skin is warm.     Capillary Refill: Capillary refill takes less than 2 seconds.     Findings: No rash.  Neurological:     Mental Status: She is alert and oriented to person, place, and time.      ED Treatments / Results  Labs (all labs ordered are listed, but only abnormal results are displayed) Labs Reviewed - No data to display  EKG None  Radiology Dg Knee Left Port  Result Date: 10/10/2018 CLINICAL DATA:  Injured left knee jumping over a fence yesterday. EXAM: PORTABLE LEFT KNEE - 1-2 VIEW COMPARISON:  None. FINDINGS: No fracture or dislocation. Mild degenerative change involving the medial compartment and patellar femoral joints with joint space loss, articular surface irregularity, subchondral sclerosis and osteophytosis. No joint effusion. Regional soft  tissues appear normal. No radiopaque body. IMPRESSION: 1. No fracture or radiopaque foreign body. 2. 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 Electronically Signed   By: Sandi Mariscal M.D.   On: 10/10/2018 17:04    Procedures Procedures (including critical care time)  Medications Ordered in ED Medications - No data to display   Initial Impression / Assessment and Plan / ED Course  I have reviewed the triage vital signs and the nursing notes.  Pertinent labs & imaging results that were available during my care of the patient were reviewed by me and considered in my medical decision making (see chart  for details).        Patient presenting for evaluation of left knee pain.  Physical exam reassuring, she is neurovascularly intact.  X-rays viewed interpreted by me, no fracture dislocation.  Likely knee sprain.  Will place patient in knee immobilizer and give crutches as needed for pain control.  Discussed symptomatic treatment with Tylenol and NSAIDs.  encourage follow-up with orthopedics if symptoms not improving in 1 week.  At this time, patient present for discharge.  Return precautions given.  Patient states she understands and agrees to plan.   Final Clinical Impressions(s) / ED Diagnoses   Final diagnoses:  Acute pain of left knee    ED Discharge Orders    None       Franchot Heidelberg, PA-C 10/10/18 1803    Davonna Belling, MD 10/10/18 2256

## 2018-10-10 NOTE — ED Triage Notes (Signed)
Pt reports jumping over fence and landed on left leg when she felt and heard her knee pop. Pt reports pain and swelling in left knee however pt has been ambulatory since the incident.

## 2018-10-21 ENCOUNTER — Other Ambulatory Visit: Payer: Self-pay | Admitting: Orthopedic Surgery

## 2018-10-21 DIAGNOSIS — S83412A Sprain of medial collateral ligament of left knee, initial encounter: Secondary | ICD-10-CM

## 2018-11-17 ENCOUNTER — Ambulatory Visit
Admission: RE | Admit: 2018-11-17 | Discharge: 2018-11-17 | Disposition: A | Discharge status: Home / Self Care | Payer: Self-pay | Source: Ambulatory Visit | Attending: Orthopedic Surgery | Admitting: Orthopedic Surgery

## 2018-11-17 DIAGNOSIS — S83412A Sprain of medial collateral ligament of left knee, initial encounter: Secondary | ICD-10-CM

## 2019-12-28 IMAGING — MR MRI OF THE LEFT KNEE WITHOUT CONTRAST
4 of 7 series · 22 of 40 positions shown · non-contrast
Comparison: Radiographs 10/10/2018

CLINICAL DATA: Fell in a hole and twisted knee 10/09/2018.
Persistent pain.

EXAM:
MRI OF THE LEFT KNEE WITHOUT CONTRAST
TECHNIQUE: Multiplanar, multisequence MR imaging of the knee was performed. No
intravenous contrast was administered.

[Series 3: T2 fat-sat · axial · 4.0mm · 0.50mm/px · z∈[-7,+108]mm · 6 of 24 slices shown]
[im 1/24]
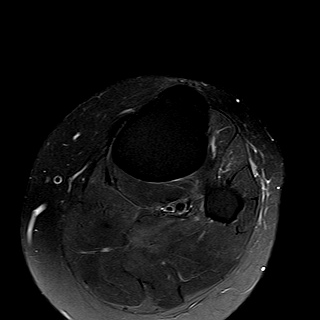
[im 5/24]
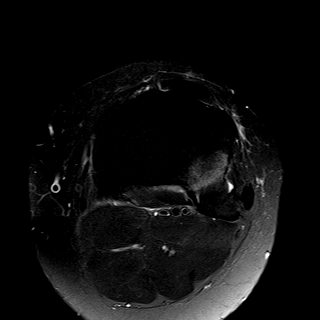
[im 10/24]
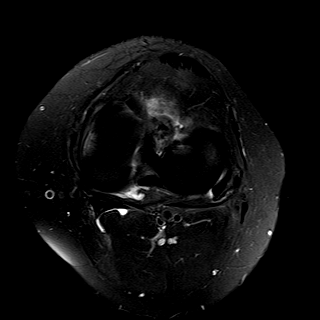
[im 14/24]
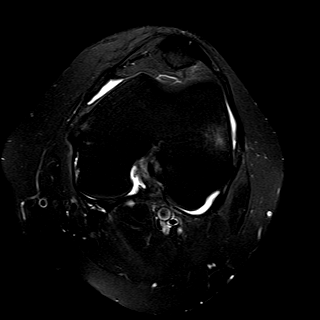
[im 19/24]
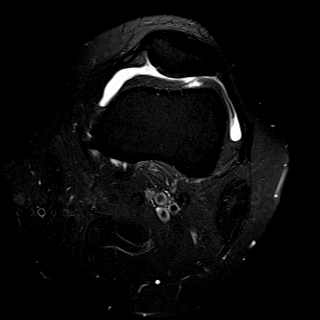
[im 24/24]
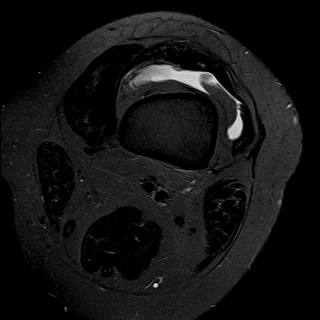

[Series 6: PD fat-sat · coronal · 3.0mm · 0.29mm/px · 7 of 31 slices shown (1 of 3)]
[im 1/31]
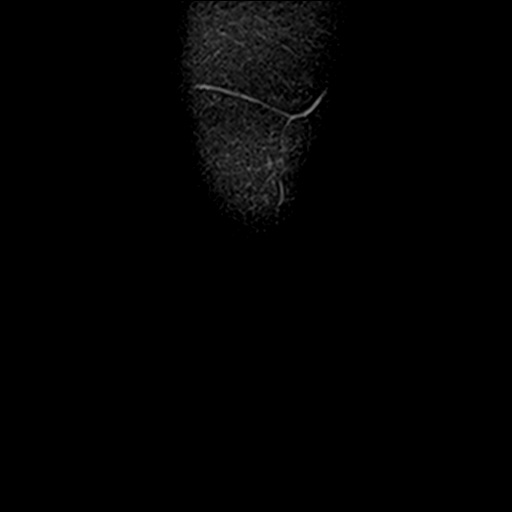
[im 6/31]
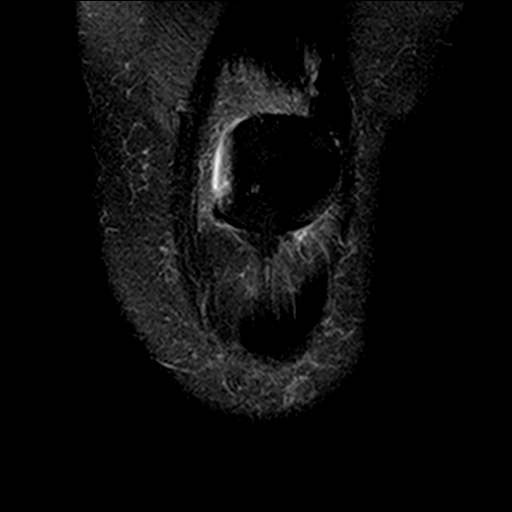
[im 11/31]
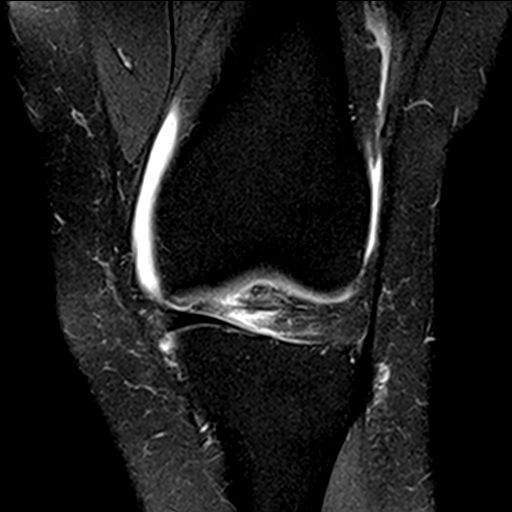
[im 16/31]
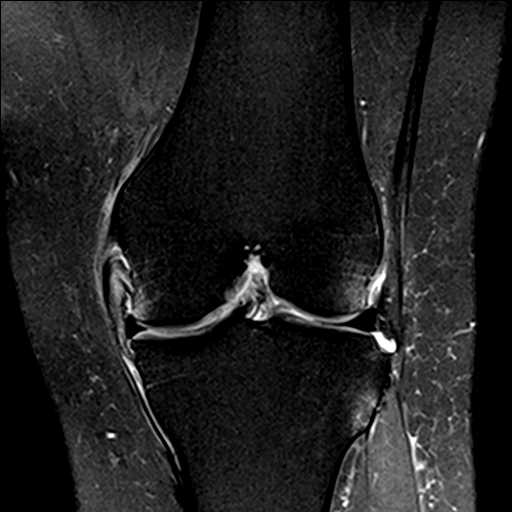
[im 21/31]
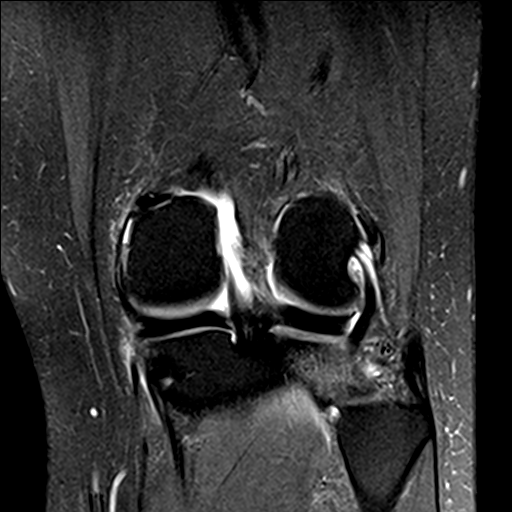
[im 26/31]
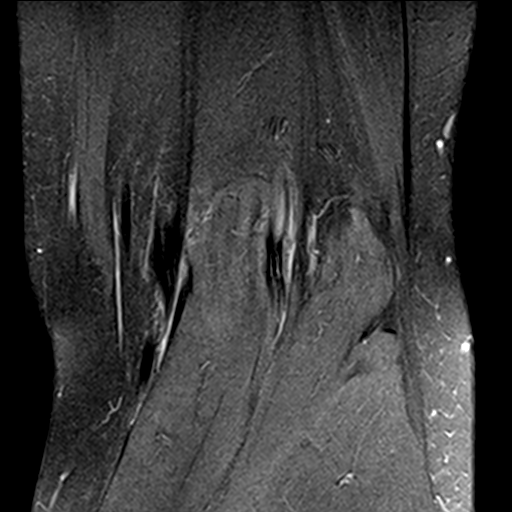
[im 31/31]
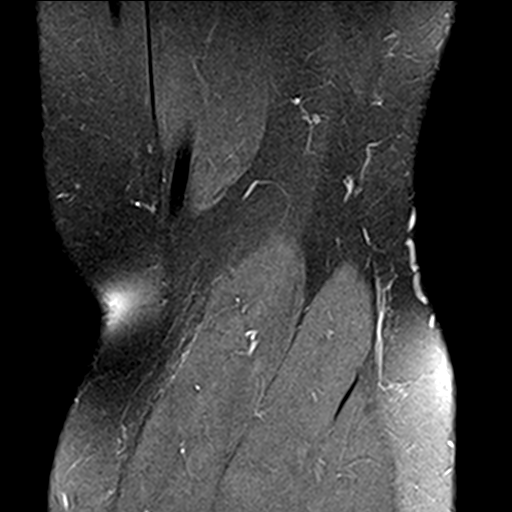

[Series 8: PD fat-sat · sagittal · 3.0mm · 0.29mm/px · 6 of 27 slices shown (2 of 3)]
[im 1/27]
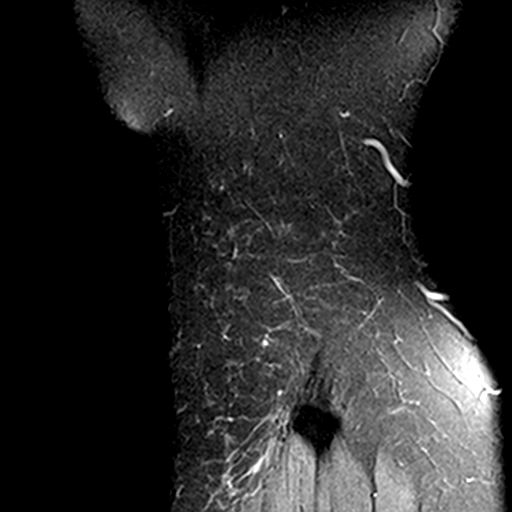
[im 6/27]
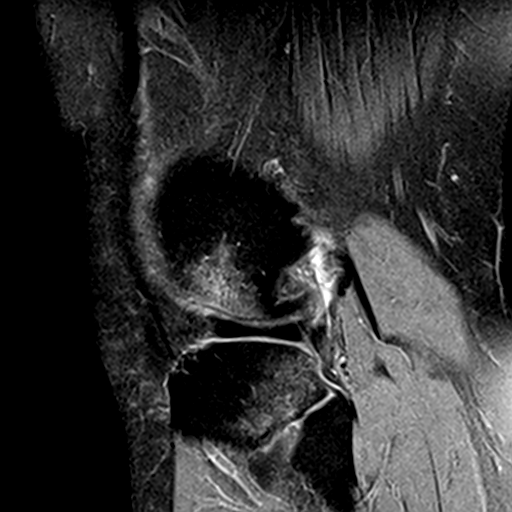
[im 11/27]
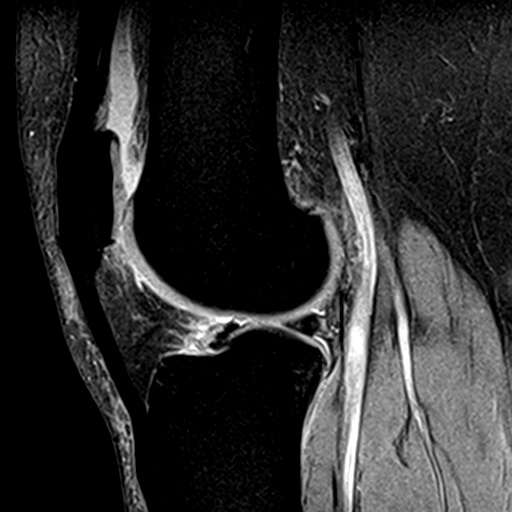
[im 16/27]
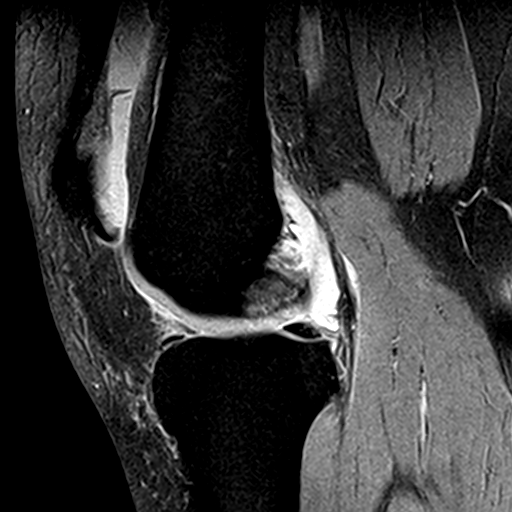
[im 21/27]
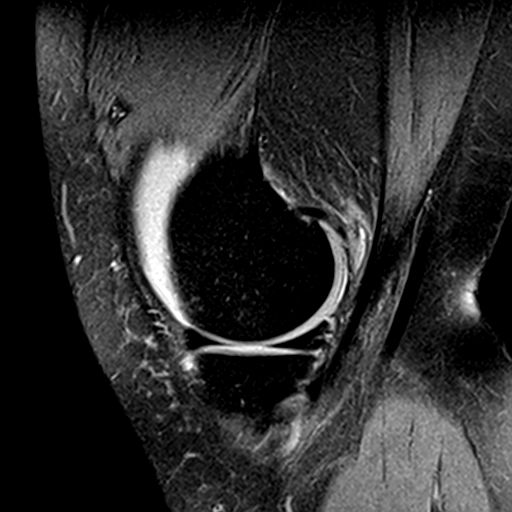
[im 27/27]
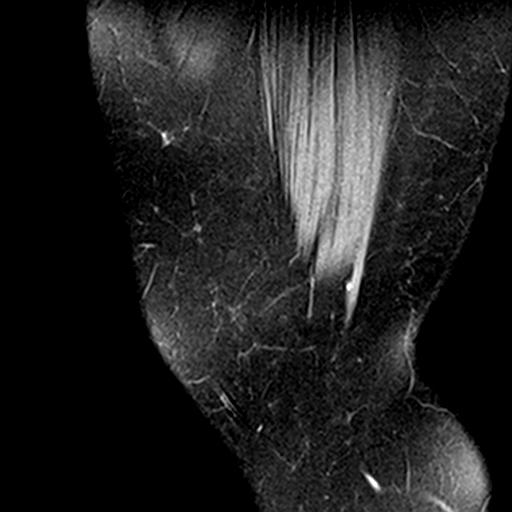

[Series 9: PD fat-sat · oblique · 2.0mm · 0.29mm/px · 3 of 11 slices shown (3 of 3)]
[im 1/11]
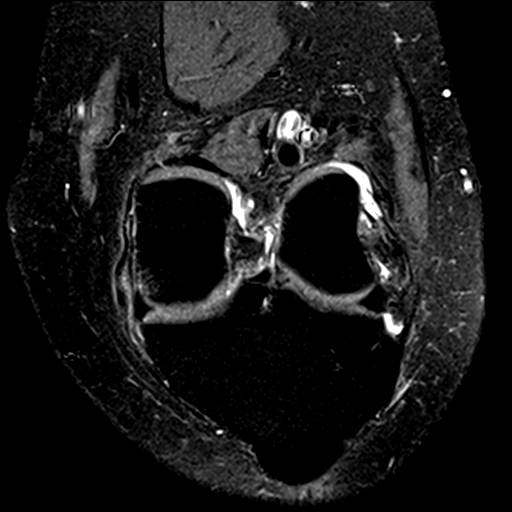
[im 6/11]
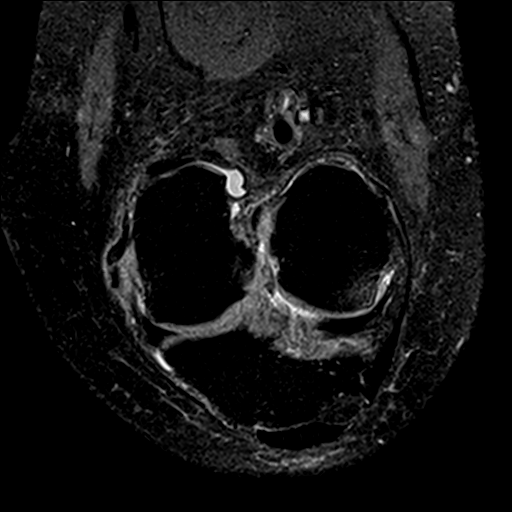
[im 11/11]
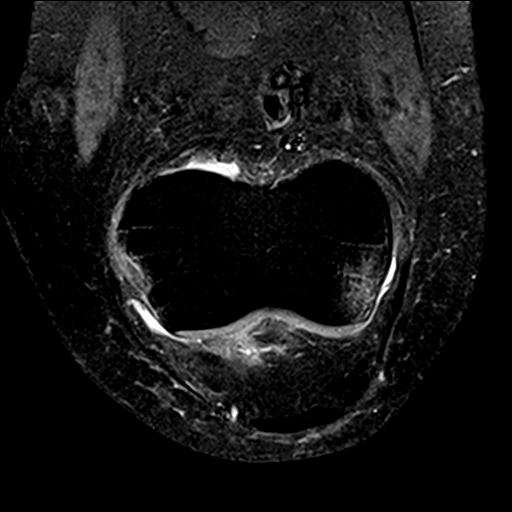

[22 of 40 positions shown; findings below may reference images not displayed]

FINDINGS: MENISCI

Medial meniscus:  Intact

Lateral meniscus:  Intact

LIGAMENTS

Cruciates: Complete midsubstance ACL rupture. The PCL is buckled but
intact.

Collaterals: Grade 2 proximal MCL sprain but no complete
tear/rupture. The LCL complex is intact.

CARTILAGE

Patellofemoral: Mild degenerative chondrosis with chondral fissuring
at the patellar apex and along the medial facet.

Medial:  Minimal degenerative chondrosis.

Lateral:  Minimal degenerative chondrosis.

Joint:  Moderate-sized joint effusion.

Popliteal Fossa: No popliteal mass. There is a small leaking Baker's
cyst.

Extensor Mechanism: The patella retinacular structures are intact
and the quadriceps and patellar tendons are intact.

Bones:  Typical pivot-shift bone contusions.

Other: Normal knee musculature.
IMPRESSION: 1. Complete midsubstance ACL rupture with associated typical
pivot-shift bone contusions. The PCL is intact.
2. Grade 2 proximal MCL sprain.  The LCL is intact.
3. Mild chondromalacia patella.
4. Moderate-sized joint effusion and small Baker's cyst.

## 2021-10-08 ENCOUNTER — Ambulatory Visit
Admission: EM | Admit: 2021-10-08 | Discharge: 2021-10-08 | Disposition: A | Discharge status: Home / Self Care | Payer: Self-pay | Attending: Internal Medicine | Admitting: Internal Medicine

## 2021-10-08 DIAGNOSIS — R21 Rash and other nonspecific skin eruption: Secondary | ICD-10-CM

## 2021-10-08 MED ORDER — HYDROXYZINE HCL 25 MG PO TABS: 25.0000 mg | ORAL_TABLET | Freq: Four times a day (QID) | ORAL | 0 refills | Status: AC | PRN

## 2021-10-08 NOTE — ED Triage Notes (Signed)
Pt presents with rash on hands and both feet since yesterday.

## 2021-10-08 NOTE — Discharge Instructions (Signed)
Please take medications as prescribed You may use aloe vera, calamine lotion or other creams for gentle skin We will call you with recommendations if labs are abnormal Return to urgent care if symptoms worsen.

## 2021-10-09 LAB — RPR: RPR Ser Ql: NONREACTIVE

## 2021-10-25 NOTE — ED Provider Notes (Signed)
[facility]    CSN: [id number] Arrival date & time: 10/08/21  1349      History   Chief Complaint Chief Complaint  Patient presents with   Rash    HPI Martha Nash is a 31 y.o. female comes to urgent care with 1 day history of itchy rash in both hands and feet.  Patient's symptoms started abruptly and has been persistent.  She denies any ulcerations.  No change in cosmetics, soaps or creams.  She has not tried any over-the-counter medications.  She is sexually active and is in a monogamous relationship. HPI  Past Medical History:  Diagnosis Date   Abscess of breast, right 11/30/2012    Patient Active Problem List   Diagnosis Date Noted   Abscess of breast, right 11/30/2012    Past Surgical History:  Procedure Laterality Date   ANTERIOR CRUCIATE LIGAMENT REPAIR     IRRIGATION AND DEBRIDEMENT ABSCESS Right 11/29/2012   Procedure: IRRIGATION AND DEBRIDEMENT RIGHT BREAST ABSCESS;  Surgeon: [name], MD;  Location: [facility];  Service: General;  Laterality: Right;    OB History   No obstetric history on file.      Home Medications    Prior to Admission medications   Medication Sig Start Date End Date Taking? Authorizing Provider  hydrOXYzine (ATARAX) 25 MG tablet Take 1 tablet (25 mg total) by mouth every 6 (six) hours as needed for itching. 10/08/21  Yes Laia Wiley, Britta Mccreedy, MD  Aspirin-Caffeine (BAYER BACK & BODY PAIN EX ST) 500-32.5 MG TABS Take 1-2 tablets by mouth every 6 (six) hours as needed (for pain).    [provider]  ibuprofen (ADVIL,MOTRIN) 600 MG tablet Take 1 tablet (600 mg total) by mouth every 6 (six) hours as needed. 08/23/17   Elpidio Anis, PA-C  oxyCODONE-acetaminophen (PERCOCET/ROXICET) 5-325 MG tablet Take 1 tablet by mouth every 6 (six) hours as needed for severe pain. 06/04/17   Eyvonne Mechanic, PA-C    Family History Family History  Problem Relation Age of Onset   Diabetes Other     Social History Social History    Tobacco Use   Smoking status: Every Day    Types: Cigarettes   Smokeless tobacco: Never  Substance Use Topics   Alcohol use: Yes    Comment: occasionally   Drug use: No     Allergies   Patient has no known allergies.   Review of Systems Review of Systems  Cardiovascular: Negative.   Gastrointestinal: Negative.   Musculoskeletal: Negative.   Skin:  Positive for color change and rash.     Physical Exam Triage Vital Signs ED Triage Vitals  Enc Vitals Group     BP 10/08/21 1356 128/90     Pulse Rate 10/08/21 1356 93     Resp 10/08/21 1356 18     Temp 10/08/21 1356 98.4 F (36.9 C)     Temp Source 10/08/21 1356 Oral     SpO2 10/08/21 1356 95 %     Weight --      Height --      Head Circumference --      Peak Flow --      Pain Score 10/08/21 1358 4     Pain Loc --      Pain Edu? --      Excl. in GC? --    No data found.  Updated Vital Signs BP 128/90 (BP Location: Left Arm)   Pulse 93   Temp 98.4 F (  36.9 C) (Oral)   Resp 18   LMP  (LMP Unknown)   SpO2 95%   Visual Acuity Right Eye Distance:   Left Eye Distance:   Bilateral Distance:    Right Eye Near:   Left Eye Near:    Bilateral Near:     Physical Exam Vitals and nursing note reviewed.  Constitutional:      General: She is not in acute distress.    Appearance: She is not ill-appearing.  Cardiovascular:     Rate and Rhythm: Normal rate and regular rhythm.  Abdominal:     General: Bowel sounds are normal.     Palpations: Abdomen is soft.  Musculoskeletal:        General: No swelling or tenderness. Normal range of motion.  Skin:    Findings: Rash present.     Comments: Erythematous rash in both hands and feet.  No ulcerations or discharge.  No swelling of the hands or feet.  Neurological:     Mental Status: She is alert.      UC Treatments / Results  Labs (all labs ordered are listed, but only abnormal results are displayed) Labs Reviewed  RPR   Narrative:    Performed at:  [address] [place], [place]  [id number] Lab Director: Jolene Schimke MD, Phone:  [phone]    EKG   Radiology No results found.  Procedures Procedures (including critical care time)  Medications Ordered in UC Medications - No data to display  Initial Impression / Assessment and Plan / UC Course  I have reviewed the triage vital signs and the nursing notes.  Pertinent labs & imaging results that were available during my care of the patient were reviewed by me and considered in my medical decision making (see chart for details).     1.  Rash involving hands and feet: Hydroxyzine as needed for itching Patient advised to use aloe vera or calamine lotion RPR We will call patient with recommendations Return precautions given. Final Clinical Impressions(s) / UC Diagnoses   Final diagnoses:  Rash and nonspecific skin eruption     Discharge Instructions      Please take medications as prescribed You may use aloe vera, calamine lotion or other creams for gentle skin We will call you with recommendations if labs are abnormal Return to urgent care if symptoms worsen.   ED Prescriptions     Medication Sig Dispense Auth. Provider   hydrOXYzine (ATARAX) 25 MG tablet Take 1 tablet (25 mg total) by mouth every 6 (six) hours as needed for itching. 24 tablet Sylena Lotter, Britta Mccreedy, MD      PDMP not reviewed this encounter.   Merrilee Jansky, MD 10/25/21 [phone]
